# Patient Record
Sex: Male | Born: 1945 | Race: Black or African American | Hispanic: No | Marital: Single | Smoking: Former smoker
Health system: Southern US, Community
[De-identification: ages and names within clinical notes are randomized; demographics above are authoritative.]

## PROBLEM LIST (undated history)

## (undated) DIAGNOSIS — F1210 Cannabis abuse, uncomplicated: Secondary | ICD-10-CM

## (undated) DIAGNOSIS — R410 Disorientation, unspecified: Secondary | ICD-10-CM

## (undated) DIAGNOSIS — I10 Essential (primary) hypertension: Secondary | ICD-10-CM

## (undated) DIAGNOSIS — I447 Left bundle-branch block, unspecified: Secondary | ICD-10-CM

## (undated) DIAGNOSIS — C3491 Malignant neoplasm of unspecified part of right bronchus or lung: Principal | ICD-10-CM

## (undated) DIAGNOSIS — N183 Chronic kidney disease, stage 3 unspecified: Secondary | ICD-10-CM

## (undated) DIAGNOSIS — K056 Periodontal disease, unspecified: Secondary | ICD-10-CM

## (undated) DIAGNOSIS — F1010 Alcohol abuse, uncomplicated: Secondary | ICD-10-CM

## (undated) DIAGNOSIS — T783XXA Angioneurotic edema, initial encounter: Secondary | ICD-10-CM

## (undated) DIAGNOSIS — Z7189 Other specified counseling: Secondary | ICD-10-CM

## (undated) DIAGNOSIS — Z5111 Encounter for antineoplastic chemotherapy: Secondary | ICD-10-CM

## (undated) DIAGNOSIS — Z720 Tobacco use: Secondary | ICD-10-CM

## (undated) DIAGNOSIS — I428 Other cardiomyopathies: Secondary | ICD-10-CM

## (undated) DIAGNOSIS — I639 Cerebral infarction, unspecified: Secondary | ICD-10-CM

## (undated) HISTORY — DX: Periodontal disease, unspecified: K05.6

## (undated) HISTORY — DX: Angioneurotic edema, initial encounter: T78.3XXA

## (undated) HISTORY — DX: Cerebral infarction, unspecified: I63.9

## (undated) HISTORY — DX: Encounter for antineoplastic chemotherapy: Z51.11

## (undated) HISTORY — DX: Other specified counseling: Z71.89

## (undated) HISTORY — DX: Malignant neoplasm of unspecified part of right bronchus or lung: C34.91

## (undated) HISTORY — DX: Other cardiomyopathies: I42.8

## (undated) HISTORY — DX: Chronic kidney disease, stage 3 (moderate): N18.3

## (undated) HISTORY — DX: Tobacco use: Z72.0

## (undated) HISTORY — DX: Chronic kidney disease, stage 3 unspecified: N18.30

## (undated) HISTORY — DX: Cannabis abuse, uncomplicated: F12.10

## (undated) HISTORY — DX: Left bundle-branch block, unspecified: I44.7

## (undated) HISTORY — DX: Essential (primary) hypertension: I10

## (undated) HISTORY — DX: Disorientation, unspecified: R41.0

## (undated) HISTORY — DX: Alcohol abuse, uncomplicated: F10.10

---

## 2002-05-06 ENCOUNTER — Encounter: Payer: Self-pay | Admitting: Emergency Medicine

## 2002-05-06 ENCOUNTER — Emergency Department (HOSPITAL_COMMUNITY): Admission: EM | Admit: 2002-05-06 | Discharge: 2002-05-06 | Payer: Self-pay

## 2004-01-12 ENCOUNTER — Emergency Department (HOSPITAL_COMMUNITY): Admission: EM | Admit: 2004-01-12 | Discharge: 2004-01-12 | Payer: Self-pay | Admitting: Emergency Medicine

## 2004-04-22 ENCOUNTER — Emergency Department (HOSPITAL_COMMUNITY): Admission: EM | Admit: 2004-04-22 | Discharge: 2004-04-22 | Payer: Self-pay | Admitting: Emergency Medicine

## 2005-01-07 ENCOUNTER — Emergency Department (HOSPITAL_COMMUNITY): Admission: EM | Admit: 2005-01-07 | Discharge: 2005-01-07 | Payer: Self-pay | Admitting: Emergency Medicine

## 2011-01-28 HISTORY — PX: US ECHOCARDIOGRAPHY: HXRAD669

## 2011-02-06 ENCOUNTER — Emergency Department (HOSPITAL_COMMUNITY): Payer: PRIVATE HEALTH INSURANCE

## 2011-02-06 ENCOUNTER — Inpatient Hospital Stay (HOSPITAL_COMMUNITY)
Admission: EM | Admit: 2011-02-06 | Discharge: 2011-02-15 | DRG: 065 | Disposition: A | Discharge status: Skilled Nursing Facility | Payer: PRIVATE HEALTH INSURANCE | Attending: Internal Medicine | Admitting: Internal Medicine

## 2011-02-06 DIAGNOSIS — N183 Chronic kidney disease, stage 3 unspecified: Secondary | ICD-10-CM | POA: Diagnosis present

## 2011-02-06 DIAGNOSIS — F10239 Alcohol dependence with withdrawal, unspecified: Secondary | ICD-10-CM | POA: Diagnosis present

## 2011-02-06 DIAGNOSIS — I129 Hypertensive chronic kidney disease with stage 1 through stage 4 chronic kidney disease, or unspecified chronic kidney disease: Secondary | ICD-10-CM | POA: Diagnosis present

## 2011-02-06 DIAGNOSIS — IMO0001 Reserved for inherently not codable concepts without codable children: Secondary | ICD-10-CM | POA: Diagnosis present

## 2011-02-06 DIAGNOSIS — R410 Disorientation, unspecified: Secondary | ICD-10-CM

## 2011-02-06 DIAGNOSIS — K069 Disorder of gingiva and edentulous alveolar ridge, unspecified: Secondary | ICD-10-CM | POA: Diagnosis present

## 2011-02-06 DIAGNOSIS — F17200 Nicotine dependence, unspecified, uncomplicated: Secondary | ICD-10-CM | POA: Diagnosis present

## 2011-02-06 DIAGNOSIS — Z794 Long term (current) use of insulin: Secondary | ICD-10-CM

## 2011-02-06 DIAGNOSIS — I509 Heart failure, unspecified: Secondary | ICD-10-CM | POA: Diagnosis present

## 2011-02-06 DIAGNOSIS — I5020 Unspecified systolic (congestive) heart failure: Secondary | ICD-10-CM | POA: Diagnosis present

## 2011-02-06 DIAGNOSIS — F1210 Cannabis abuse, uncomplicated: Secondary | ICD-10-CM | POA: Diagnosis present

## 2011-02-06 DIAGNOSIS — F1020 Alcohol dependence, uncomplicated: Secondary | ICD-10-CM | POA: Diagnosis present

## 2011-02-06 DIAGNOSIS — F29 Unspecified psychosis not due to a substance or known physiological condition: Secondary | ICD-10-CM | POA: Diagnosis present

## 2011-02-06 DIAGNOSIS — F10939 Alcohol use, unspecified with withdrawal, unspecified: Secondary | ICD-10-CM | POA: Diagnosis present

## 2011-02-06 DIAGNOSIS — Z7982 Long term (current) use of aspirin: Secondary | ICD-10-CM

## 2011-02-06 DIAGNOSIS — I639 Cerebral infarction, unspecified: Secondary | ICD-10-CM

## 2011-02-06 DIAGNOSIS — T46905A Adverse effect of unspecified agents primarily affecting the cardiovascular system, initial encounter: Secondary | ICD-10-CM | POA: Diagnosis not present

## 2011-02-06 DIAGNOSIS — I6350 Cerebral infarction due to unspecified occlusion or stenosis of unspecified cerebral artery: Principal | ICD-10-CM | POA: Diagnosis present

## 2011-02-06 DIAGNOSIS — I428 Other cardiomyopathies: Secondary | ICD-10-CM | POA: Diagnosis present

## 2011-02-06 DIAGNOSIS — F1010 Alcohol abuse, uncomplicated: Secondary | ICD-10-CM

## 2011-02-06 DIAGNOSIS — E876 Hypokalemia: Secondary | ICD-10-CM | POA: Diagnosis present

## 2011-02-06 DIAGNOSIS — K056 Periodontal disease, unspecified: Secondary | ICD-10-CM | POA: Diagnosis present

## 2011-02-06 DIAGNOSIS — I447 Left bundle-branch block, unspecified: Secondary | ICD-10-CM

## 2011-02-06 DIAGNOSIS — T783XXA Angioneurotic edema, initial encounter: Secondary | ICD-10-CM

## 2011-02-06 HISTORY — DX: Disorientation, unspecified: R41.0

## 2011-02-06 HISTORY — DX: Left bundle-branch block, unspecified: I44.7

## 2011-02-06 HISTORY — DX: Cannabis abuse, uncomplicated: F12.10

## 2011-02-06 HISTORY — DX: Alcohol abuse, uncomplicated: F10.10

## 2011-02-06 HISTORY — DX: Cerebral infarction, unspecified: I63.9

## 2011-02-06 HISTORY — DX: Angioneurotic edema, initial encounter: T78.3XXA

## 2011-02-06 LAB — POCT I-STAT, CHEM 8
BUN: 15 mg/dL (ref 6–23)
Calcium, Ion: 1.12 mmol/L (ref 1.12–1.32)
Chloride: 104 meq/L (ref 96–112)
Creatinine, Ser: 1.7 mg/dL — ABNORMAL HIGH (ref 0.4–1.5)
Glucose, Bld: 162 mg/dL — ABNORMAL HIGH (ref 70–99)
HCT: 50 % (ref 39.0–52.0)
Hemoglobin: 17 g/dL (ref 13.0–17.0)
Potassium: 3.2 meq/L — ABNORMAL LOW (ref 3.5–5.1)
Sodium: 140 meq/L (ref 135–145)
TCO2: 18 mmol/L (ref 0–100)

## 2011-02-06 LAB — URINE MICROSCOPIC-ADD ON

## 2011-02-06 LAB — URINALYSIS, ROUTINE W REFLEX MICROSCOPIC
Glucose, UA: NEGATIVE mg/dL
Hgb urine dipstick: NEGATIVE
Ketones, ur: 15 mg/dL — AB
Protein, ur: 100 mg/dL — AB
Urobilinogen, UA: 0.2 mg/dL (ref 0.0–1.0)

## 2011-02-06 LAB — DIFFERENTIAL
Basophils Absolute: 0.1 10*3/uL (ref 0.0–0.1)
Basophils Relative: 1 % (ref 0–1)
Eosinophils Absolute: 0.1 10*3/uL (ref 0.0–0.7)
Eosinophils Relative: 1 % (ref 0–5)
Lymphocytes Relative: 17 % (ref 12–46)
Lymphs Abs: 2.1 10*3/uL (ref 0.7–4.0)
Monocytes Absolute: 1.1 10*3/uL — ABNORMAL HIGH (ref 0.1–1.0)
Neutrophils Relative %: 72 % (ref 43–77)

## 2011-02-06 LAB — CBC
HCT: 46.5 % (ref 39.0–52.0)
Hemoglobin: 15.8 g/dL (ref 13.0–17.0)
MCH: 31 pg (ref 26.0–34.0)
MCHC: 34 g/dL (ref 30.0–36.0)
MCV: 91.4 fL (ref 78.0–100.0)
Platelets: 218 K/uL (ref 150–400)
RBC: 5.09 MIL/uL (ref 4.22–5.81)
RDW: 13.8 % (ref 11.5–15.5)
WBC: 12 K/uL — ABNORMAL HIGH (ref 4.0–10.5)

## 2011-02-06 LAB — POCT CARDIAC MARKERS
CKMB, poc: 2.4 ng/mL (ref 1.0–8.0)
Myoglobin, poc: 254 ng/mL (ref 12–200)
Troponin i, poc: <0.05 ng/mL (ref 0.00–0.09)

## 2011-02-06 LAB — PROTIME-INR
INR: 1.09 (ref 0.00–1.49)
Prothrombin Time: 14.3 s (ref 11.6–15.2)

## 2011-02-06 LAB — D-DIMER, QUANTITATIVE: D-Dimer, Quant: 1.41 ug/mL-FEU — ABNORMAL HIGH (ref 0.00–0.48)

## 2011-02-07 ENCOUNTER — Emergency Department (HOSPITAL_COMMUNITY): Payer: PRIVATE HEALTH INSURANCE

## 2011-02-07 ENCOUNTER — Inpatient Hospital Stay (HOSPITAL_COMMUNITY): Payer: PRIVATE HEALTH INSURANCE

## 2011-02-07 DIAGNOSIS — I6350 Cerebral infarction due to unspecified occlusion or stenosis of unspecified cerebral artery: Secondary | ICD-10-CM

## 2011-02-07 DIAGNOSIS — I517 Cardiomegaly: Secondary | ICD-10-CM

## 2011-02-07 LAB — MAGNESIUM: Magnesium: 1.6 mg/dL (ref 1.5–2.5)

## 2011-02-07 LAB — CARDIAC PANEL(CRET KIN+CKTOT+MB+TROPI)
CK, MB: 4.5 ng/mL — ABNORMAL HIGH (ref 0.3–4.0)
CK, MB: 4.4 ng/mL — ABNORMAL HIGH (ref 0.3–4.0)
Relative Index: 1.3 (ref 0.0–2.5)
Relative Index: 1.2 (ref 0.0–2.5)
Total CK: 337 U/L — ABNORMAL HIGH (ref 7–232)
Troponin I: 0.04 ng/mL (ref 0.00–0.06)
Troponin I: 0.03 ng/mL (ref 0.00–0.06)

## 2011-02-07 LAB — LIPID PANEL
Cholesterol: 197 mg/dL (ref 0–200)
HDL: 85 mg/dL (ref >39)

## 2011-02-07 LAB — COMPREHENSIVE METABOLIC PANEL
ALT: 24 U/L (ref 0–53)
Alkaline Phosphatase: 64 U/L (ref 39–117)
BUN: 12 mg/dL (ref 6–23)
CO2: 21 mEq/L (ref 19–32)
Chloride: 106 mEq/L (ref 96–112)
GFR calc non Af Amer: >60 mL/min (ref >60)
Glucose, Bld: 132 mg/dL — ABNORMAL HIGH (ref 70–99)
Potassium: 3.2 mEq/L — ABNORMAL LOW (ref 3.5–5.1)
Sodium: 139 mEq/L (ref 135–145)
Total Bilirubin: 0.9 mg/dL (ref 0.3–1.2)
Total Protein: 6.6 g/dL (ref 6.0–8.3)

## 2011-02-07 LAB — APTT: aPTT: 33 seconds (ref 24–37)

## 2011-02-07 LAB — CK TOTAL AND CKMB (NOT AT ARMC)
CK, MB: 5 ng/mL — ABNORMAL HIGH (ref 0.3–4.0)
Total CK: 261 U/L — ABNORMAL HIGH (ref 7–232)

## 2011-02-07 LAB — GLUCOSE, CAPILLARY: Glucose-Capillary: 124 mg/dL — ABNORMAL HIGH (ref 70–99)

## 2011-02-07 LAB — RAPID URINE DRUG SCREEN, HOSP PERFORMED
Amphetamines: NOT DETECTED
Barbiturates: NOT DETECTED
Opiates: NOT DETECTED

## 2011-02-07 MED ORDER — XENON XE 133 GAS
10.0000 | GAS_FOR_INHALATION | Freq: Once | RESPIRATORY_TRACT | Status: AC | PRN
  Administered 2011-02-07: 10 via RESPIRATORY_TRACT

## 2011-02-07 MED ORDER — GADOBENATE DIMEGLUMINE 529 MG/ML IV SOLN
14.0000 mL | Freq: Once | INTRAVENOUS | Status: AC
  Administered 2011-02-07: 14 mL via INTRAVENOUS

## 2011-02-07 MED ORDER — TECHNETIUM TO 99M ALBUMIN AGGREGATED
6.0000 | Freq: Once | INTRAVENOUS | Status: AC | PRN
  Administered 2011-02-07: 6 via INTRAVENOUS

## 2011-02-08 DIAGNOSIS — R9389 Abnormal findings on diagnostic imaging of other specified body structures: Secondary | ICD-10-CM

## 2011-02-08 LAB — CBC
MCH: 31 pg (ref 26.0–34.0)
MCHC: 33.6 g/dL (ref 30.0–36.0)
MCV: 92.3 fL (ref 78.0–100.0)
Platelets: 179 10*3/uL (ref 150–400)
RDW: 13.9 % (ref 11.5–15.5)
WBC: 11.2 10*3/uL — ABNORMAL HIGH (ref 4.0–10.5)

## 2011-02-08 LAB — DIFFERENTIAL
Eosinophils Absolute: 0.1 10*3/uL (ref 0.0–0.7)
Eosinophils Relative: 1 % (ref 0–5)
Lymphs Abs: 2.1 10*3/uL (ref 0.7–4.0)
Monocytes Absolute: 0.7 10*3/uL (ref 0.1–1.0)
Monocytes Relative: 6 % (ref 3–12)

## 2011-02-08 LAB — BASIC METABOLIC PANEL
BUN: 12 mg/dL (ref 6–23)
Creatinine, Ser: 0.82 mg/dL (ref 0.4–1.5)
GFR calc non Af Amer: >60 mL/min (ref >60)
Glucose, Bld: 198 mg/dL — ABNORMAL HIGH (ref 70–99)

## 2011-02-08 LAB — MAGNESIUM: Magnesium: 1.9 mg/dL (ref 1.5–2.5)

## 2011-02-08 LAB — GLUCOSE, CAPILLARY: Glucose-Capillary: 129 mg/dL — ABNORMAL HIGH (ref 70–99)

## 2011-02-08 NOTE — H&P (Signed)
Jim [name], Jim [name]              ACCOUNT NO.:  [id number]  MEDICAL RECORD NO.:  [id number]           PATIENT TYPE:  E  LOCATION:  [facility]                         FACILITY:  [facility]  PHYSICIAN:  Homero Fellers, MD   DATE OF BIRTH:  April 07, 1946  DATE OF ADMISSION:  02/06/2011 DATE OF DISCHARGE:                             HISTORY & PHYSICAL   PRIMARY CARE PHYSICIAN:  Dr. Apolinar Junes.  CHIEF COMPLAINTS:  Left leg weakness and ambulatory difficulties.  HISTORY OF PRESENT ILLNESS:  This is a 66 year old African American gentleman who developed an acute episode of left leg weakness around noon today accompanied by inability to bear weight on the left leg or walk.  The patient said he just lost control of his left leg suddenly. There was no warning signs, that was not accompanied with left upper extremity weakness, slurred speech, nausea, or vomiting.  He also does not have any chest pain, shortness of breath, or even palpitation.  He had some dizziness, but no loss of consciousness.  In the emergency room, initial CT of the head did not show any acute stroke.  EKG showed a left bundle-branch block, but there are no old EKGs for comparison. Cardiology was called in and the patient was seen and considered not to be having any acute myocardial infarction.  His cardiac enzymes are normal.  The patient had LFTs and his alcohol level at presentation was 88.  He however does not have any evidence of confusion or disorientation.  The patient was diagnosed with high blood pressure years ago, but has not been placed on any medication by his physician.  PAST MEDICAL HISTORY:  High blood pressure, diabetes mellitus.  Medication list unavailable at this time.  ALLERGIES:  None.  SOCIAL HISTORY:  Smokes about half pack per day.  No IV drug abuse. Uses marijuana at times and drinks moderately.  FAMILY HISTORY:  Mother died of breast cancer, history of coronary artery disease in  grandparents.  Ten-point review of systems is negative except as above.  PHYSICAL EXAMINATION:  VITAL SIGNS:  Blood pressure is 149/94, pulse initially 133-140, but currently about 120, last pulse rate was actually 113, temperature 98.5, respirations 24, O2 sat is 100%. GENERAL:  The patient is doing well, appear comfortable in no distress. Left leg weakness has improved when I saw him. HEENT:  Pallor.  Extraocular movements are intact. NECK:  Supple.  No JVD, adenopathy, or thyromegaly. LUNGS:  Clear bilaterally to auscultation. HEART:  S1 and S2.  No murmurs, rubs, or gallops. ABDOMEN:  Full, soft, nontender.  Bowel sounds present.  No masses. EXTREMITIES:  No edema, clubbing, or cyanosis. NEUROLOGIC:  Cranial nerves II through XII intact.  Speech is clear. Motor function appear to be depressed on the left lower extremity, probably about 4/5 in power.  Coordination is normal on both sides. Gait was not tested.  LABORATORY DATA:  Urinalysis unremarkable apart from mild proteinuria. Urinalysis showed many bacteria, but WBC is normal.  Alcohol level 88. INR 1.09.  White count 12.0, hemoglobin 15.8, platelet count is 218. Glucose 162, potassium 3.2, sodium 140, creatinine 1.7, BUN 15.  Head CT showed no acute findings, mild atrophy and mild chronic small vessel white matter ischemic changes in both cerebral hemisphere with old lacunar infarcts.  D-dimer is 1.41, which is elevated.  Chest x-ray showed no acute cardiopulmonary disease.  Again EKG showed clear bundle- branch block with questionable Q-waves in the precordial leads.  ASSESSMENT: 1. This is a 65 year old man with history of high blood pressure and     diabetes presenting with acute left lower extremity weakness to     rule out an acute cerebrovascular accident.  If no acute infarct is     found on MRI, this should be considered as a transient ischemic     attack. 2. Left bundle-branch block with no old EKG.  This is  considered as     new and should be worked up as acute coronary syndrome until     otherwise. 3. Uncontrolled diabetes mellitus. 4. Uncontrolled high blood pressure. 5. Hypokalemia. 6. Elevated D-dimer; in view of tachycardia, I think it is reasonable     to rule out pulmonary embolism. 7. History of alcohol and tobacco abuse. 8. Chronic kidney disease, stage III.  PLAN: 1. Admit to Stroke Unit, do a stroke workup including MRI/MRA of the     brain, 2-D echo and carotid Dopplers, get a VQ scan to rule out PE. 2. Get hemoglobin A1c, start metformin and watch kidney function.  If     getting worse, consider other alternatives.  Replace potassium and     check magnesium level.  Give one dose of Lopressor 50 mg x1 then 25     mg b.i.d.  The patient's condition is overall stable, will be followed by Cardiology for this admission.     Homero Fellers, MD     FA/MEDQ  D:  02/07/2011  T:  02/07/2011  Job:  [id number]  Electronically Signed by Homero Fellers  on 02/08/2011 09:30:26 AM

## 2011-02-09 DIAGNOSIS — I6789 Other cerebrovascular disease: Secondary | ICD-10-CM

## 2011-02-09 DIAGNOSIS — I428 Other cardiomyopathies: Secondary | ICD-10-CM

## 2011-02-09 LAB — GLUCOSE, CAPILLARY
Glucose-Capillary: 157 mg/dL — ABNORMAL HIGH (ref 70–99)
Glucose-Capillary: 128 mg/dL — ABNORMAL HIGH (ref 70–99)
Glucose-Capillary: 170 mg/dL — ABNORMAL HIGH (ref 70–99)

## 2011-02-09 LAB — BASIC METABOLIC PANEL
BUN: 9 mg/dL (ref 6–23)
Calcium: 9.4 mg/dL (ref 8.4–10.5)
GFR calc non Af Amer: >60 mL/min (ref >60)
Glucose, Bld: 142 mg/dL — ABNORMAL HIGH (ref 70–99)
Potassium: 4 mEq/L (ref 3.5–5.1)
Sodium: 136 mEq/L (ref 135–145)

## 2011-02-09 LAB — CBC
Hemoglobin: 14.4 g/dL (ref 13.0–17.0)
MCH: 31.1 pg (ref 26.0–34.0)
MCV: 92.7 fL (ref 78.0–100.0)
RBC: 4.63 MIL/uL (ref 4.22–5.81)
WBC: 9.3 10*3/uL (ref 4.0–10.5)

## 2011-02-09 LAB — PROTIME-INR: Prothrombin Time: 14.2 seconds (ref 11.6–15.2)

## 2011-02-09 LAB — MAGNESIUM: Magnesium: 2.2 mg/dL (ref 1.5–2.5)

## 2011-02-10 LAB — CBC
HCT: 42.2 % (ref 39.0–52.0)
MCH: 30.9 pg (ref 26.0–34.0)
MCV: 91.7 fL (ref 78.0–100.0)
RDW: 14.2 % (ref 11.5–15.5)
WBC: 11 10*3/uL — ABNORMAL HIGH (ref 4.0–10.5)

## 2011-02-10 LAB — GLUCOSE, CAPILLARY
Glucose-Capillary: 131 mg/dL — ABNORMAL HIGH (ref 70–99)
Glucose-Capillary: 139 mg/dL — ABNORMAL HIGH (ref 70–99)

## 2011-02-10 LAB — BASIC METABOLIC PANEL
CO2: 24 mEq/L (ref 19–32)
Chloride: 102 mEq/L (ref 96–112)
GFR calc Af Amer: >60 mL/min (ref >60)
Potassium: 3.9 mEq/L (ref 3.5–5.1)

## 2011-02-11 LAB — GLUCOSE, CAPILLARY

## 2011-02-11 LAB — BASIC METABOLIC PANEL
BUN: 15 mg/dL (ref 6–23)
CO2: 23 mEq/L (ref 19–32)
Calcium: 9.8 mg/dL (ref 8.4–10.5)
Creatinine, Ser: 0.97 mg/dL (ref 0.4–1.5)
Glucose, Bld: 145 mg/dL — ABNORMAL HIGH (ref 70–99)

## 2011-02-12 ENCOUNTER — Inpatient Hospital Stay (HOSPITAL_COMMUNITY): Payer: PRIVATE HEALTH INSURANCE

## 2011-02-12 DIAGNOSIS — I6330 Cerebral infarction due to thrombosis of unspecified cerebral artery: Secondary | ICD-10-CM

## 2011-02-12 LAB — FOLATE RBC: RBC Folate: 849 ng/mL — ABNORMAL HIGH

## 2011-02-12 LAB — CBC
MCV: 92.6 fL (ref 78.0–100.0)
Platelets: 159 10*3/uL (ref 150–400)
RDW: 13.8 % (ref 11.5–15.5)
WBC: 10.4 10*3/uL (ref 4.0–10.5)

## 2011-02-12 LAB — GLUCOSE, CAPILLARY
Glucose-Capillary: 135 mg/dL — ABNORMAL HIGH (ref 70–99)
Glucose-Capillary: 215 mg/dL — ABNORMAL HIGH (ref 70–99)

## 2011-02-12 LAB — BASIC METABOLIC PANEL
BUN: 14 mg/dL (ref 6–23)
Chloride: 100 mEq/L (ref 96–112)
Creatinine, Ser: 0.99 mg/dL (ref 0.4–1.5)

## 2011-02-12 LAB — RPR: RPR Ser Ql: NONREACTIVE

## 2011-02-12 LAB — VITAMIN B12: Vitamin B-12: 257 pg/mL (ref 211–911)

## 2011-02-13 LAB — CBC
HCT: 42.8 % (ref 39.0–52.0)
MCV: 92.4 fL (ref 78.0–100.0)
RBC: 4.63 MIL/uL (ref 4.22–5.81)
WBC: 10.4 10*3/uL (ref 4.0–10.5)

## 2011-02-13 LAB — BASIC METABOLIC PANEL
GFR calc non Af Amer: >60 mL/min (ref >60)
Glucose, Bld: 186 mg/dL — ABNORMAL HIGH (ref 70–99)
Potassium: 4.1 mEq/L (ref 3.5–5.1)
Sodium: 136 mEq/L (ref 135–145)

## 2011-02-13 LAB — DIFFERENTIAL
Eosinophils Relative: 1 % (ref 0–5)
Lymphocytes Relative: 23 % (ref 12–46)
Lymphs Abs: 2.4 10*3/uL (ref 0.7–4.0)
Neutrophils Relative %: 61 % (ref 43–77)

## 2011-02-13 LAB — GLUCOSE, CAPILLARY
Glucose-Capillary: 124 mg/dL — ABNORMAL HIGH (ref 70–99)
Glucose-Capillary: 170 mg/dL — ABNORMAL HIGH (ref 70–99)

## 2011-02-14 LAB — DIFFERENTIAL
Basophils Absolute: 0 10*3/uL (ref 0.0–0.1)
Basophils Relative: 0 % (ref 0–1)
Eosinophils Absolute: 0.1 10*3/uL (ref 0.0–0.7)
Eosinophils Relative: 1 % (ref 0–5)
Lymphocytes Relative: 24 % (ref 12–46)
Monocytes Absolute: 1.4 10*3/uL — ABNORMAL HIGH (ref 0.1–1.0)

## 2011-02-14 LAB — BASIC METABOLIC PANEL
Calcium: 9.7 mg/dL (ref 8.4–10.5)
GFR calc Af Amer: >60 mL/min (ref >60)
GFR calc non Af Amer: >60 mL/min (ref >60)
Glucose, Bld: 189 mg/dL — ABNORMAL HIGH (ref 70–99)
Potassium: 4 mEq/L (ref 3.5–5.1)
Sodium: 136 mEq/L (ref 135–145)

## 2011-02-14 LAB — CBC
MCHC: 33.9 g/dL (ref 30.0–36.0)
Platelets: 198 10*3/uL (ref 150–400)
RDW: 13.7 % (ref 11.5–15.5)
WBC: 11.5 10*3/uL — ABNORMAL HIGH (ref 4.0–10.5)

## 2011-02-14 LAB — GLUCOSE, CAPILLARY
Glucose-Capillary: 218 mg/dL — ABNORMAL HIGH (ref 70–99)
Glucose-Capillary: 148 mg/dL — ABNORMAL HIGH (ref 70–99)
Glucose-Capillary: 245 mg/dL — ABNORMAL HIGH (ref 70–99)

## 2011-02-14 LAB — MAGNESIUM: Magnesium: 2.2 mg/dL (ref 1.5–2.5)

## 2011-02-15 LAB — GLUCOSE, CAPILLARY: Glucose-Capillary: 157 mg/dL — ABNORMAL HIGH (ref 70–99)

## 2011-02-21 NOTE — Consult Note (Signed)
NAMERUBERT, FREDIANI              ACCOUNT NO.:  [id number]  MEDICAL RECORD NO.:  [id number]           PATIENT TYPE:  I  LOCATION:  3029                         FACILITY:  [facility]  PHYSICIAN:  Thana Farr, MD    DATE OF BIRTH:  1946/01/03  DATE OF CONSULTATION:  02/07/2011 DATE OF DISCHARGE:                                CONSULTATION   TIME:  10:20 a.m.  REASON FOR CONSULTATION:  Sudden onset left leg weakness, questionable intracranial abnormalities.  HISTORY OF PRESENT ILLNESS:  This is a pleasant 65 year old African American male who has known hypertension, diabetes, and is on insulin at home.  The patient states he does have one-half pack per day habit of smoking.  He does drink approximately 2 times one-fifth of alcohol throughout the week and does take part in [facility].  The patient was at his baseline on February 06, 2011, at approximately 1 p.m. when he had sudden onset of left leg weakness.  The patient states that the only symptom he noted was weakness.  He had no sensory change, no dysarthria, no slurred speech, no vision changes.  The patient tried to get up and walk and noted that he was significantly weak with his left leg and needed assistance by holding onto furniture.  At that time, he believed that he could resolve this symptom by going to be alcohol store and having some alcohol.  He had his son drive him to the local [employer] store however on return at approximately 8 p.m. he noted his symptoms were not dissipating and his friend drove him to [facility] ED.  At [facility] ED, the patient underwent a CT scan of the head, which was negative; however, the patient continues to have left leg weakness.  Neurology asked to consult for possibility of intracranial abnormalities.  PAST MEDICAL HISTORY:  Hypertension, diabetes.  MEDICATIONS:  The patient is on insulin at home.  While he has been in the hospital, he has been on aspirin 325 mg, low-molecular-weight heparin,  insulin, lisinopril, Lopressor, Protonix, potassium chloride, and Crestor 20 mg daily.  ALLERGIES:  No known drug allergies.  FAMILY HISTORY:  Positive for breast cancer and CAD.  SOCIAL HISTORY:  The patient smokes approximately half pack per day, does take part in drinking.  States he does not do illicit drugs; however, he did admit to doing THC.  REVIEW OF SYSTEMS:  Negative with the exception of the above.  PHYSICAL EXAMINATION:  VITAL SIGNS:  Blood pressure 139/80, pulse 76, respiration 20, temperature 98.5. NEUROLOGIC:  The patient is alert, is oriented x3, carries out 2 and 3- step commands, pupils are equal, round, reactive to light and accommodating.  Conjugate gaze.  Extraocular movements are intact. Visual fields grossly intact.  Tongue is midline.  Uvula is midline.  No dysarthria, no aphasia, no slurred speech.  The patient does show a left nasolabial fold decrease, but his smile is symmetrical.  The patient's sensation is grossly intact to pinprick, light touch throughout. Shoulder shrug and head turn within normal limits. COORDINATION:  Finger-to-nose is smooth.  Heel-to-shin is smooth. MOTOR:  The patient shows 5/5  strength on his right upper and lower extremity.  On his left upper extremity, he has 5/5, with his biceps flexion 4/5, his triceps extension 4/5 with his hip flexion on the left 4/5 on the left knee extension 5/5 with dorsiflexion, plantar flexion on the left.  Deep tendon reflexes are 1+ throughout.  Downgoing toes bilaterally. DRIFT:  Positive left lower extremity drift. SENSATION:  Grossly intact to pinprick and light touch throughout. PULMONARY:  Clear to auscultation bilaterally. CARDIOVASCULAR:  S1 and S2.  Regular rate and rhythm. NECK:  Negative bruits and supple.  LABORATORY DATA:  Positive for THC.  D-dimer is 1.41.  Sodium 139, potassium 3.2, chloride 106, CO2 of 21, BUN 12, creatinine 1.16, glucose 132.  Hemoglobin 15.8, hematocrit 46.5,  white blood cell count 12.0, platelets 218.  Triglycerides 154, cholesterol 197, HDL 85, LDL 81. HbA1c is pending.  TSH 0.94.  IMAGING:  CT of head was negative for new infarct, positive old lacunar infarcts.  ASSESSMENT/PLAN:  This is a pleasant 65 year old male with sudden onset of left leg weakness that has continued from February 06, 2011, through February 07, 2011. 1. At the present time, CT scan of head is negative; however, cannot     rule out intracranial abnormalities. 2. At this time, we would recommend continuing stroke workup and start     aspirin daily. 3. I have changed the patient's medication and added 20 mg of Crestor     daily for hyperlipidemia. 4. We will continue to follow the patient while he is in-house.  I     have discussed these findings with Dr. Thad Ranger and she is in full     agreement.     Felicie Morn, PA-C   ______________________________ Thana Farr, MD    DS/MEDQ  D:  02/07/2011  T:  02/08/2011  Job:  [id number]  Electronically Signed by Felicie Morn PA-C on 02/12/2011 11:53:40 AM Electronically Signed by Thana Farr MD on 02/21/2011 06:48:11 PM

## 2011-02-23 NOTE — Consult Note (Signed)
NAMESHERVIN, CYPERT              ACCOUNT NO.:  [id number]  MEDICAL RECORD NO.:  [id number]           PATIENT TYPE:  I  LOCATION:  3029                         FACILITY:  [facility]  PHYSICIAN:  Colleen Can. Deborah Chalk, M.D.DATE OF BIRTH:  01/10/1946  DATE OF CONSULTATION:  02/08/2011 DATE OF DISCHARGE:                                CONSULTATION   PRIMARY CARE PHYSICIAN:  Dr. Apolinar Junes.  PRIMARY CARDIOLOGIST:  None.  CHIEF COMPLAINT:  Left ventricular dysfunction.  HISTORY OF PRESENT ILLNESS:  Jim [name] is a 65 year old male with no cardiac history.  Jim [name] was admitted on April 10 with new-onset weakness and was diagnosed with a CVA.  His EF was decreased on 2-D echocardiogram and Cardiology was asked to evaluate him.  Jim [name] never gets chest pain.  Occasionally, Jim [name] gets tachy palpitations with shortness of breath during emotional stress.  These symptoms never occur with exertion.  They resolve once Jim [name] calms down. They happen occasionally but not often.  Jim [name] denies any history of dyspnea on exertion, orthopnea, PND, or lower extremity edema.  Jim [name] has no history of palpitations except during periods of emotional stress and these do not last very long.  Jim [name] has occasional left knee swelling, but no history of lower extremity edema.  Since being in the hospital, Jim [name] has had no chest pain or shortness of breath.  PAST MEDICAL HISTORY: 1. Status post echocardiogram on February 07, 2011, showing an EF of 35-     40% with mild LVH. 2. Diabetes. 3. Hypertension. 4. Hyperlipidemia. 5. Emphysema.  PAST SURGICAL HISTORY:  None.  ALLERGIES:  None.  CURRENT MEDICATIONS: 1. Aspirin 325 mg a day. 2. DVT Lovenox. 3. Folic acid 1 mg a day. 4. Sliding scale insulin. 5. Lisinopril 10 mg a day. 6. Ativan. 7. Magnesium sulfate p.r.n. 8. Lopressor 25 mg b.i.d. 9. Multivitamin. 10.Nicotine patch. 11.Protonix 40 mg a day. 12.Potassium p.r.n. 13.Thiamine 100 mg daily.  SOCIAL HISTORY:  Jim [name]  lives in [place] with family nearby.  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  Jim [name] has a greater than 30-pack-year history of ongoing tobacco use and states Jim [name] has not had a drink in awhile but has a history of drinking 2 pints a week and when his left lower extremity first became weak Jim [name] went to the liquor store because Jim [name] thought drinking alcohol might improve it.  Jim [name] admits to [facility] use.  FAMILY HISTORY:  His mother died in her 55s with cancer and his father died in his 35s, but neither of his parents nor any siblings have heart disease, although his grandfather died in his 76s from an MI.  REVIEW OF SYSTEMS:  Shortness of breath and palpitations as described above.  Jim [name] coughs and wheezes occasionally.  His left lower extremity is weak but Jim [name] has no other areas of weakness or deficits.  Jim [name] does not tolerate cold weather well.  Jim [name] has not had any recent illnesses fevers or chills.  Full 14-point review of systems is otherwise negative except as stated in the HPI.  PHYSICAL EXAMINATION:  VITAL SIGNS:  Temperature is 97.9, blood pressure 146/96, pulse 80, respiratory rate 18, and O2 saturation 100% on room air. GENERAL:  Jim [name] is a well-developed [place] American male in no acute distress. HEENT:  Essentially normal. NECK:  There is no lymphadenopathy, thyromegaly, or bruit noted and JVD is essentially normal at 6 cm. CV:  His heart is regular in rate and rhythm with an S1 and S2 and no significant murmur, rub, or gallop is noted.  Distal pulses are intact in all 4 extremities. LUNGS:  Jim [name] has few rales in the bases but generally appear clear. SKIN:  No rashes or lesions are noted. ABDOMEN:  Soft and nontender with active bowel sounds. EXTREMITIES:  There is no cyanosis, clubbing, or edema noted. MUSCULOSKELETAL:  There is no joint deformity or effusions and no spine or CVA tenderness. NEUROLOGIC:  Jim [name] is alert and oriented  with cranial nerves II through XII grossly intact.  Jim [name] has decreased strength in the left lower extremity only.  Chest x-ray; no acute disease.  V/Q scan; low probability for PE.  MRA shows an occlusion of the right anterior cerebral artery A2 segment with an acute right ACA infarct.  EKG; sinus tach, rate 118 with a left bundle-branch block and no old is available for comparison.  LABORATORY VALUES:  CK-MB 370/4.4, then 337/4.5, then 261/5.0 with troponin I 0.03, then 0.04, then 0.03, and CK-MB index all normal. Hemoglobin 14.9, hematocrit 44.4, WBCs 11.2, and platelets 179.  Sodium 138, potassium 3.6, chloride 107, CO2 of 22, BUN initially 15, now 12. Creatinine initially 1.7, now 0.82.  Glucose 198.  Hemoglobin A1c 6.5. Urine drug screen positive for THC.  Urinalysis positive for many bacteria.  Magnesium 1.9.  EtOH on admission 88.  Total cholesterol 197, triglycerides 154, HDL 85, and LDL 81.  IMPRESSION:  Jim [name] was seen today by Dr. Deborah Chalk, the patient evaluated and the data reviewed.  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  His initial 2-D echo showed mild left ventricular hypertrophy and an ejection fraction of 40-45%.  Jim [name] has a left bundle-branch block of unknown duration.  There are many possible causes of this decreased ejection fraction including EtOH, hypertension, and possible ischemia. Currently, Jim [name] is in normal sinus rhythm.  His exam is otherwise unremarkable.  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  His cardiomyopathy is possibly related to ischemia but most likely related to EtOH or hypertension.  A TEE is planned on February 09, 2011, which will give [place] a better look at his left ventricular function.  Then, we can consider a Lexiscan Myoview if Jim [name] has regional wall motion abnormalities.  This could be done as an outpatient.  Jim [name] is currently stable from a cardiovascular standpoint and we will  continue to follow him with you.     Theodore Demark, PA-C   ______________________________ Colleen Can. Deborah Chalk, M.D.    RB/MEDQ  D:  02/08/2011  T:  02/09/2011  Job:  [id number]  Electronically Signed by Theodore Demark PA-C on 02/14/2011 10:23:03 AM Electronically Signed by Roger Shelter M.D. on 02/23/2011 03:52:37 PM

## 2011-02-27 ENCOUNTER — Encounter: Payer: Self-pay | Admitting: Cardiovascular Disease

## 2011-03-01 ENCOUNTER — Ambulatory Visit: Payer: 59 | Admitting: Cardiovascular Disease

## 2011-03-01 ENCOUNTER — Telehealth: Payer: Self-pay | Admitting: Cardiovascular Disease

## 2011-03-01 NOTE — Telephone Encounter (Signed)
Records received via Courier...records were sent to [facility] today gave to Christine(covering Nishan) for appt 03/01/11 @ 11:45. 03/01/11/km

## 2011-03-02 ENCOUNTER — Encounter: Payer: Self-pay | Admitting: Nurse Practitioner

## 2011-03-06 ENCOUNTER — Other Ambulatory Visit: Payer: Self-pay | Admitting: Internal Medicine

## 2011-03-06 ENCOUNTER — Encounter: Payer: Self-pay | Admitting: Cardiovascular Disease

## 2011-03-06 DIAGNOSIS — G8194 Hemiplegia, unspecified affecting left nondominant side: Secondary | ICD-10-CM

## 2011-03-07 ENCOUNTER — Other Ambulatory Visit (HOSPITAL_COMMUNITY): Payer: 59

## 2011-03-07 ENCOUNTER — Ambulatory Visit (HOSPITAL_COMMUNITY)
Admission: RE | Admit: 2011-03-07 | Discharge: 2011-03-07 | Disposition: A | Discharge status: Home / Self Care | Payer: PRIVATE HEALTH INSURANCE | Source: Ambulatory Visit | Attending: Internal Medicine | Admitting: Internal Medicine

## 2011-03-07 DIAGNOSIS — G8190 Hemiplegia, unspecified affecting unspecified side: Secondary | ICD-10-CM | POA: Insufficient documentation

## 2011-03-07 DIAGNOSIS — G8194 Hemiplegia, unspecified affecting left nondominant side: Secondary | ICD-10-CM

## 2011-03-07 DIAGNOSIS — I10 Essential (primary) hypertension: Secondary | ICD-10-CM | POA: Insufficient documentation

## 2011-03-07 DIAGNOSIS — E119 Type 2 diabetes mellitus without complications: Secondary | ICD-10-CM | POA: Insufficient documentation

## 2011-03-07 NOTE — Group Therapy Note (Signed)
[name], [name]              ACCOUNT NO.:  [id number]  MEDICAL RECORD NO.:  [id number]           PATIENT TYPE:  I  LOCATION:  3029                         FACILITY:  [facility]  PHYSICIAN:  Ramiro Harvest, MD    DATE OF BIRTH:  Mar 07, 1946                                PROGRESS NOTE   This is a progress note covering the events from February 06, 2011 through February 13, 2011.  PRIMARY CARE PHYSICIAN: Dr. Apolinar Junes.  CONSULTANTS ON THE CASE: Cardiology, Dr. Roger Shelter consulted on the patient February 08, 2011. Neurology consultation was done.  The patient was consulted on upon by Dr. Thana Farr of Neurology on February 07, 2011.  CURRENT DIAGNOSES: 1. Facial swelling likely secondary to ACE inhibitor angioedema. 2. Acute cerebrovascular accident. 3. Cardiomyopathy/left ventricular dysfunction. 4. Type 2 diabetes, hemoglobin A1c of 6.5. 5. Alcohol withdrawal. 6. Confusion/sundowning likely secondary to acute cerebrovascular    accident and alcohol withdrawal. 7. Extensive dental/periodontal disease/  DISCHARGE MEDICATIONS: Will be dictated per discharging physician.  PROCEDURES PERFORMED: 1. Chest x-ray was done, February 06, 2011, that showed no acute     cardiopulmonary process seen. 2. CT of the head without contrast was done February 06, 2011 that showed     no acute abnormality, mild atrophy, and mild chronic small vessel     white matter disease, ischemic changes in both cerebral hemispheres     old lacunar infarcts. 3. A V/Q scan was done February 07, 2011 that showed no segmental or     lobar ventilation.  No perfusion defect is seen.  No ventilation-     perfusion mismatch is seen.  Low probability for pulmonary     embolism.  On the washout phase of the ventilation study, there is     slight diffuse air trapping. 4. MRI, MRA of the head and neck was done, February 07, 2011, that showed     acute infarction in the right anterior cerebral artery territory,     atherosclerotic  disease in the anterior posterior cerebral arteries     bilaterally, occlusion of right anterior cerebral artery in the A2     segment.  No significant carotid or vertebral stenosis.     Orthopantogram was done February 12, 2011 that showed extensive dental     and periodontal disease.  A 2-D echo was done on February 07, 2011     that showed a left ventricular wall thickness, was increased in a     pattern of mild LVH, systolic function was moderately reduced.  EF     of 35-40%.  There was increased relative contribution of atrial     contraction to ventricular filling.  Left atrium was mildly     dilated.  A transesophageal echocardiogram was obtained on February 09, 2011 that showed a moderately-to-severely reduced systolic     function, EF of 30-35%, diffuse hypokinesis, left atrium.  No     evidence of thrombus in atrial cavity or appendage.  Negative     saline microcavitation study.  Carotid Dopplers were done on February 07, 2011 that showed no significant extracranial carotid artery     stenosis, demonstrated mild mixed plaque noted throughout     bilaterally.  Vertebrals are patent with antegrade flow.  BRIEF ADMISSION HISTORY AND PHYSICAL: [name] is a 64-year African American gentleman who developed an acute episode of left lower extremity weakness around noon on the day of admission accompanied by inability to bear weight on the left lower extremity and to walk.  The patient stated that he just lost control of his left leg suddenly.  He had no warning signs and that was not accompanied with left upper extremity weakness, slurred speech, nausea or vomiting.  The patient also does not have any chest pain, no shortness of breath, no palpitation.  The patient did have some dizziness but no loss of consciousness.  In the ED, initial CT of the head did not show an acute stroke.  EKG showed a left bundle-branch block.  There was no old EKG for comparison.  Cardiology was  called and the patient was seen and considered not to be having an acute MI. Cardiac enzymes were normal.  The patient had LFT and his alcohol level at presentation was 88.  He, however, did not have any evidence of confusion or disorientation.  The patient was diagnosed with elevated blood pressure years ago but has not been placed on any medication by any physician.  For the rest of the admission history and physical, please see H and P dictated by Dr. Phineas Douglas, job number [id number].  HOSPITAL COURSE: 1. Acute CVA.  The patient was admitted with symptoms consistent with     an acute stroke.  A stroke workup was done.  MRI which was done     with results as stated above was consistent with an acute stroke in     the right anterior cerebral artery territory.  Carotid Dopplers     were done which were negative as stated above.  A 2-D echo was done     which showed a depressed ejection fraction.  A TEE was also     obtained which was negative for any embolic source.  The patient     was placed on aspirin.  PT/OT saw the patient during the     hospitalization.  Neurology was consulted.  The patient was seen in     consultation by Dr. Thana Farr on February 07, 2011 and it was     recommended to continue the patient on aspirin.  The patient was     also placed on Crestor 20 mg daily for hyperlipidemia.  The patient     was monitored and he was followed by PT/OT during this     hospitalization.  A fasting lipid panel which was obtained during     the patient's stroke workup came back with an LDL of 81 and as such     Crestor was discontinued.  The patient was placed on blood pressure     regimen of Lopressor as well as spironolactone and initially     lisinopril for better blood pressure control.  However, the patient     did develop a facial swelling.  ACE inhibitor has been held.  The     patient's blood pressure has subsequently been stable.  The patient     is currently awaiting placement  for SNF versus inpatient rehab.     The patient is currently in stable condition. 2. Facial swelling.  On the  night of April 15 through early hours of     the 16th, the patient was noted to have developed a facial     swelling.  The patient had recently been started on an ACE     inhibitor.  There was a concern of ACE inhibitor-induced angioedema     and as such ACE inhibitor was discontinued.  The patient's sister     did mention that the patient did have some tooth pain just prior to     admission.  As such, an orthopantogram was obtained to rule out     abscess.  Orthopantogram did show extensive dental and periodontal     disease, was negative for any abscess formation.  The patient was     placed on IV steroids and placed on a steroid taper.  He was placed     on Benadryl as well as Pepcid.  The patient's facial swelling     improved.  The patient did not have any compromise of his airway.     The patient was speaking in full sentences and tolerating p.o.'s.     The patient improved clinically and he will be placed on a steroid     taper, Benadryl taper, and a Pepcid taper.  The patient's ACE     inhibitor has been discontinued and added to the list of his     allergies and this will need to be monitored and followed. 3. Cardiomyopathy/LV dysfunction.  On admission, the patient was noted     to have a left bundle-branch block on EKG.  There was no old EKG     for comparison purpose.  A cardiology consultation was obtained.     The patient was seen in consultation by Dr. Deborah Chalk on February 08, 2011 at which time a 2-D echo had been done that showed mild LVH     and an EF of 40-45%.  There was concern that the patient did have a     cardiomyopathy with unknown etiology.  There was a question as to     whether the differential did include hypertension, alcohol use, and     possible ischemia.  The patient was in normal sinus rhythm and was     asymptomatic.  It was felt this could  possibly be a nonischemic     cardiomyopathy as he did have a history of alcohol use and     hypertension.  A TEE was obtained secondary to his stroke on February 09, 2011 with the results as stated above which did show an EF of     30-35% with diffuse hypokinesis and a systolic function was     moderately-to-severely reduced.  Spironolactone was added to the     patient's blood pressure regimen.  The patient had also been     started on ACE inhibitor which was subsequently discontinued     secondary to facial swelling and concern for ACE inhibitor-induced     angioedema.  The patient was followed per Cardiology during the     hospitalization.  Also maintained on a beta-blocker and     spironolactone and aspirin.  It was recommended that the patient     will need a Lexiscan Myoview once he has recovered from his acute     stroke.  It will be deferred to Cardiology as to when this may be     done, whether it may be  done inpatient versus outpatient.  The     patient is currently in stable condition and is asymptomatic and is     currently on aspirin, a beta blocker, and spironolactone.  ACE     inhibitor was discontinued secondary to probable ACE inhibitor-     induced angioedema.  The patient is currently in stable condition. 4  Alcohol withdrawal.  During the hospitalization, the patient did have episodes of confusion as well as hallucinations.  The patient did have a history of alcohol abuse.  He was subsequently placed on Ativan, CIWA-Ar protocol, and has been improving in terms of his alcohol withdrawal and his hallucinations. 1. Extensive dental/periodontal disease.  When the patient did have     the facial swelling, his family had stated that he had some tooth     pain just prior to admission and as such there was a concern for     abscess formation.  An orthopantogram was obtained on February 12, 2011 that showed extensive dental and periodontal disease.  The     patient will  likely need to follow up with a dentist as an     outpatient once he is discharged.  May consider calling the dentist     at [facility] for further evaluation during this hospitalization,     so he could be set up and scheduled for evaluation once he is     discharged from the hospital.  The rest of the patient's issues     have remained stable.  It has been a pleasure taking care of [name].     Ramiro Harvest, MD     DT/MEDQ  D:  02/13/2011  T:  02/13/2011  Job:  [id number]  cc:   Colleen Can. Deborah Chalk, M.D. Fax: [phone]  Dr. Quinn Plowman, MD  Electronically Signed by Ramiro Harvest MD on 03/07/2011 03:04:36 PM

## 2011-03-07 NOTE — Discharge Summary (Signed)
Jim [name], Jim [name]              ACCOUNT NO.:  [id number]  MEDICAL RECORD NO.:  [id number]           PATIENT TYPE:  I  LOCATION:  3029                         FACILITY:  [facility]  PHYSICIAN:  Jim [name], M.D.     DATE OF BIRTH:  [birth year]  DATE OF ADMISSION:  02/06/2011 DATE OF DISCHARGE:                              DISCHARGE SUMMARY   PRIMARY CARE PHYSICIAN:  According to the H and P is Dr. Apolinar [name].  PRESENTING COMPLAINT:  Left leg weakness and trouble ambulating.  DISCHARGE DIAGNOSES: 1. Ischemic acute infarct in right anterior cerebral artery territory. 2. Continued confusion, possibly secondary to cerebrovascular accident     versus dementia. 3. Angioedema from lisinopril. 4. Cardiomyopathy with systolic heart failure, newly diagnosed. 5. Diabetes mellitus type 2 with A1c of 6.5. 6. Hypertension. 7. Alcohol abuse with alcohol withdrawal during hospital stay. 8. Extensive dental and periodontal disease. 9. Nicotine abuse. 10.The patient also admitted some marijuana abuse. 11.Left bundle-branch block on EKG.  DISCHARGE MEDICATIONS: 1. Aspirin 81 mg daily. 2. Famotidine 20 mg daily. 3. Folic acid 1 mg daily. 4. NovoLog 6 units subcu t.i.d. with meals. 5. Metoprolol 50 mg twice a day. 6. Multivitamins daily. 7. Nicotine patch 14 mg daily.  This should then be tapered down after     a week to 7 mg daily. 8. Nitroglycerin sublingual tab 0.4 mg every 5 minutes as needed up to     3 doses. 9. Spironolactone 25 mg daily. 10.Thiamine 100 mg daily. 11.Crestor 10 mg daily.  ALLERGIES:  ACE INHIBITORS, which result in angioedema.  CONSULTS: 1. Neuro consult with Jim Farr, MD 2. Cardiology consult with Jim Can. Jim Chalk, MD  HOSPITAL COURSE:  Please refer to progress note dictated on February 13, 2011, by Dr. Ramiro [name], job number [id number].  This is a 65 year old African American male, who developed acute left lower extremity weakness and resultant difficulty  ambulating.  The patient was brought to the ER.  In the ER, initial CT did not reveal any abnormality.  EKG revealed a left bundle-branch block without any old EKG to compare with.  The patient was also noted to have an elevated alcohol level at 88 and elevated blood pressure.  Acute CVA.  MRI of the brain performed on February 07, 2011, revealed acute infarct in the right anterior cerebral artery territory involving the anterior corpus callosum and pericallosal tissue.  MRA revealed atherosclerotic disease in the anterior posterior cerebral arteries bilaterally.  There was also occlusion of the right anterior cerebral artery in the A2 segment.  MRA of the neck reveals vertebral arteries that were widely patent.  Carotid artery patent bilaterally without significant stenosis.  The patient has undergone physical therapy and is recommended that he go to a rehab facility.  His family has chosen [name] for him.  The patient also underwent alcohol withdrawal.  Today is day 9 of his admission.  His withdrawal has resolved, but he does still have some underlying confusion, mainly this is to place.  He is able to recognize his family members and is aware that he has had a stroke.  I  am unsure if this confusion is from previous underlying dementia, alcohol abuse, or whether this is from his recent CVA.  Either way, the patient is unable to live alone and care for himself as he was prior to admission.  The patient's 2-D echo revealed a systolic heart failure, which was moderate-to-severe in the range of 30% to 35% with diffuse hypokinesis. No significant valvular abnormalities were noted.  A Cardiology consult was requested.  He was evaluated by Dr. Deborah [name].  He was placed on ACE inhibitor, spironolactone, and metoprolol.  However, he subsequently developed facial edema and ACE inhibitor was discontinued.  He was started on steroids, Benadryl, and H2 blocker with resolution of  the edema.  The patient did also have a transesophageal echo, which confirmed the EF of 30% to 35%.  No evidence of thrombus was noted in the atrial cavity or the appendage.  It is recommended by cardiology that he have an outpatient stress test.  The patient was also noted to have extensive dental and periodontal disease and also pantogram obtained on February 12, 2011, confirmed this and it is recommended for the patient to follow up with a dentist.  LABORATORY DATA:  Pertinent blood work.  The patient had a lipid profile, which showed a mildly elevated triglycerides 154, cholesterol was 197, HDL 85, and LDL 81.  A urine drug screen on admission was positive for tetrahydrocannabinol.  PHYSICAL EXAMINATION:  GENERAL:  On physical exam, the patient is awake and alert, again disoriented to place, but is aware that he is going to leave this facility today. NEUROLOGIC:  Cranial nerves II through XII intact.  Strength reveals 3/5 strength in his left lower extremity.  All other extremities revealed 5/5 strength. HEART:  Regular rate and rhythm.  No murmurs. LUNGS:  Clear bilaterally.  Normal respiratory effort.  No use of accessory muscles. ABDOMEN:  Soft, nontender, and nondistended.  Bowel sounds positive. EXTREMITIES:  No cyanosis, clubbing, or edema.  CONDITION ON DISCHARGE:  Stable.  The patient's POA has called today and I have spoken with her.  She states that she is not sure if Dr. Apolinar [name] is his primary care physician.  She states that he often sees different physicians, therefore at this point, I am not sure whom he will followup with. However in followup, the patient does need a Myoview stress test as recommended by Cardiology.  TIME SPENT:  Time on discharge today was 50 minutes.     Jim [name], M.D.     SR/MEDQ  D:  02/15/2011  T:  02/15/2011  Job:  [id number]  cc:   Dr. Apolinar [name]  Electronically Signed by Jim [name] M.D. on 03/07/2011 11:00:04 PM

## 2011-03-08 ENCOUNTER — Encounter: Payer: Self-pay | Admitting: *Deleted

## 2011-03-09 ENCOUNTER — Ambulatory Visit (INDEPENDENT_AMBULATORY_CARE_PROVIDER_SITE_OTHER): Payer: 59 | Admitting: Nurse Practitioner

## 2011-03-09 ENCOUNTER — Encounter: Payer: Self-pay | Admitting: Nurse Practitioner

## 2011-03-09 VITALS — BP 120/98 | HR 64 | Wt 178.0 lb

## 2011-03-09 DIAGNOSIS — R0609 Other forms of dyspnea: Secondary | ICD-10-CM

## 2011-03-09 DIAGNOSIS — F1910 Other psychoactive substance abuse, uncomplicated: Secondary | ICD-10-CM | POA: Insufficient documentation

## 2011-03-09 DIAGNOSIS — R0600 Dyspnea, unspecified: Secondary | ICD-10-CM

## 2011-03-09 DIAGNOSIS — I10 Essential (primary) hypertension: Secondary | ICD-10-CM

## 2011-03-09 DIAGNOSIS — I639 Cerebral infarction, unspecified: Secondary | ICD-10-CM

## 2011-03-09 DIAGNOSIS — I428 Other cardiomyopathies: Secondary | ICD-10-CM | POA: Insufficient documentation

## 2011-03-09 DIAGNOSIS — I6350 Cerebral infarction due to unspecified occlusion or stenosis of unspecified cerebral artery: Secondary | ICD-10-CM

## 2011-03-09 DIAGNOSIS — N183 Chronic kidney disease, stage 3 unspecified: Secondary | ICD-10-CM | POA: Insufficient documentation

## 2011-03-09 LAB — BASIC METABOLIC PANEL
BUN: 18 mg/dL (ref 6–23)
CO2: 22 mEq/L (ref 19–32)
Calcium: 9.7 mg/dL (ref 8.4–10.5)
Chloride: 107 mEq/L (ref 96–112)
Creatinine, Ser: 1 mg/dL (ref 0.4–1.5)
GFR: 94.04 mL/min (ref >60.00)
Glucose, Bld: 180 mg/dL — ABNORMAL HIGH (ref 70–99)
Potassium: 4.3 mEq/L (ref 3.5–5.1)
Sodium: 140 mEq/L (ref 135–145)

## 2011-03-09 LAB — BRAIN NATRIURETIC PEPTIDE: Pro B Natriuretic peptide (BNP): 94 pg/mL (ref 0.0–100.0)

## 2011-03-09 MED ORDER — HYDRALAZINE HCL 25 MG PO TABS: 25.0000 mg | ORAL_TABLET | Freq: Two times a day (BID) | ORAL | Status: AC

## 2011-03-09 MED ORDER — ISOSORBIDE MONONITRATE ER 30 MG PO TB24: 30.0000 mg | ORAL_TABLET | Freq: Every day | ORAL | Status: DC

## 2011-03-09 NOTE — Assessment & Plan Note (Addendum)
He is on low dose Lopressor and Aldactone. I have added Imdur 30 mg and Hydralazine 25 mg BID. I will see him back in about 1 month. BMET and BNP are checked today. We will see if he is a candidate for ACE. He was informed of his CHF diagnosis. His low EF is probably due to alcohol abuse and may improve with cessation. I don't think he needs a Lexiscan at this time. I would like to see a trial of medicines. If he continues to improve then we can reconsider.

## 2011-03-09 NOTE — Assessment & Plan Note (Signed)
Imdur and Hydralazine are started today. Hopefully this will help his blood pressure.

## 2011-03-09 NOTE — Patient Instructions (Signed)
Please stay on the current medicines. I have added Imdur 30 mg daily and Hydralazine 25 mg BID to his regimen I will see again in about 1 month.

## 2011-03-09 NOTE — Assessment & Plan Note (Signed)
He is told of the importance of cessation.

## 2011-03-09 NOTE — Assessment & Plan Note (Signed)
Labs are checked today.  

## 2011-03-09 NOTE — Progress Notes (Signed)
Jim [name] Date of Birth: 1946/05/16   History of Present Illness: Jim [name] is seen back today for a post hospital visit. He is seen for Dr. Deborah Chalk. He was admitted back in April with a CVA. He had an echo, showing severe LV dysfunction. He has a history of multi substance abuse. He is now in a rehab facility. He hopes to go home soon. He is currently not drinking or smoking but it is because of his environment. He did go through withdrawal while hospitalized. He continues to have issues with his left leg. It remains weak. It is difficult for him to stand. He says he is not short of breath. He has no edema. He denies chest pain. He has had some palpitations that he says are chronic. His problem list has CKD. He is not on ACE. He is only on low dose Lopressor and Aldactone.  Current Outpatient Prescriptions on File Prior to Visit  Medication Sig Dispense Refill  . aspirin 81 MG tablet Take 81 mg by mouth daily.        . famotidine (PEPCID) 20 MG tablet Take 20 mg by mouth daily.        . folic acid (FOLVITE) 1 MG tablet Take 1 mg by mouth daily.        . insulin aspart (NOVOLOG) 100 UNIT/ML injection Inject 6 Units into the skin 3 (three) times daily before meals.        Marland Kitchen LORazepam (ATIVAN) 0.5 MG tablet Take 0.5 mg by mouth every 8 (eight) hours.        . metoprolol (LOPRESSOR) 50 MG tablet Take 50 mg by mouth 2 (two) times daily.        . multivitamin (THERAGRAN) per tablet Take 1 tablet by mouth daily.        . nicotine (NICODERM CQ - DOSED IN MG/24 HR) 7 mg/24hr patch Place 1 patch onto the skin daily.        . nitroGLYCERIN (NITROSTAT) 0.4 MG SL tablet Place 0.4 mg under the tongue every 5 (five) minutes as needed.        . rosuvastatin (CRESTOR) 10 MG tablet Take 10 mg by mouth daily.        Marland Kitchen spironolactone (ALDACTONE) 25 MG tablet Take 25 mg by mouth daily.        . hydrALAZINE (APRESOLINE) 25 MG tablet Take 1 tablet (25 mg total) by mouth 2 (two) times daily.  120 tablet  11    . isosorbide mononitrate (IMDUR) 30 MG 24 hr tablet Take 1 tablet (30 mg total) by mouth daily.  30 tablet  11  . thiamine 100 MG tablet Take 100 mg by mouth daily.          Allergies  Allergen Reactions  . Lisinopril     Angioedema    Past Medical History  Diagnosis Date  . Hypertension   . Diabetes mellitus   . Kidney disease, chronic, stage III (GFR 30-59 ml/min)   . Periodontal disease   . CVA (cerebral infarction) 02/06/2011    Ischemic acute infarct in right anterior cerebral artery territory  . Confusion 02/06/2011    possibly secondary to cerebrovascular accident  versus dementia  . Angioedema 02/06/2011    lisinopril  . Cardiomyopathy 02/06/2011    Cardiomyopathy with systolic heart failure, newly diagnosed  . Alcohol abuse 02/06/2011    with alcohol withdrawal during hospital stay  . Periodontal disease     extensive  .  Tobacco abuse   . Left bundle branch block 02/06/2011  . Marijuana abuse 02/06/2011  . Nonischemic cardiomyopathy     History reviewed. No pertinent past surgical history.  History  Smoking status  . Former Smoker -- 0.5 packs/day  . Types: Cigarettes  . Quit date: 01/28/2011  Smokeless tobacco  . Not on file    History  Alcohol Use  . Yes    Moderate    Family History  Problem Relation Age of Onset  . Coronary artery disease Other     Grandparents, unspecified  . Cancer Mother   . Cancer Father     Review of Systems: The review of systems is positive for left leg weakness. No chest pain. No shortness of breath, cough or edema. He seems to be tolerating his medicines.  All other systems were reviewed and are negative.  Physical Exam: BP 120/98  Pulse 64  Wt 178 lb (80.74 kg) Patient is in no acute distress. He is in a wheelchair today. Skin is warm and dry. Color is normal.  HEENT is remarkable for facial droop. Normocephalic/atraumatic. PERRL. Sclera are nonicteric. Neck is supple. No masses. No JVD. Lungs are fairly clear.  Cardiac exam shows a regular rate and rhythm. No S3 noted. Abdomen is soft. Extremities are without edema. Gait and ROM are intact. No gross neurologic deficits are otherwise noted.  LABORATORY DATA:  Pending   Assessment / Plan:

## 2011-03-14 ENCOUNTER — Telehealth: Payer: Self-pay | Admitting: *Deleted

## 2011-03-14 NOTE — Telephone Encounter (Signed)
Message copied by Barnetta Hammersmith on Wed Mar 14, 2011  7:55 AM ------      Message from: Norma Fredrickson      Created: Fri Mar 09, 2011  4:45 PM       Patient resides at [facility] facility. Labs are ok. BNP is low.

## 2011-03-16 NOTE — Telephone Encounter (Signed)
RN faxed lab results to [facility] and [facility] at [phone].  All numbers for pt are disconnected.

## 2011-04-09 ENCOUNTER — Ambulatory Visit: Payer: 59 | Admitting: Nurse Practitioner

## 2011-04-11 ENCOUNTER — Ambulatory Visit: Payer: 59 | Admitting: Nurse Practitioner

## 2011-04-20 ENCOUNTER — Ambulatory Visit: Payer: 59 | Admitting: Nurse Practitioner

## 2013-11-05 ENCOUNTER — Encounter (HOSPITAL_COMMUNITY): Payer: Self-pay | Admitting: Emergency Medicine

## 2013-11-05 ENCOUNTER — Emergency Department (HOSPITAL_COMMUNITY)
Admission: EM | Admit: 2013-11-05 | Discharge: 2013-11-05 | Disposition: A | Discharge status: Home / Self Care | Payer: Medicare HMO | Attending: Emergency Medicine | Admitting: Emergency Medicine

## 2013-11-05 DIAGNOSIS — N183 Chronic kidney disease, stage 3 unspecified: Secondary | ICD-10-CM | POA: Insufficient documentation

## 2013-11-05 DIAGNOSIS — Z8719 Personal history of other diseases of the digestive system: Secondary | ICD-10-CM | POA: Insufficient documentation

## 2013-11-05 DIAGNOSIS — Z79899 Other long term (current) drug therapy: Secondary | ICD-10-CM | POA: Insufficient documentation

## 2013-11-05 DIAGNOSIS — Z794 Long term (current) use of insulin: Secondary | ICD-10-CM | POA: Insufficient documentation

## 2013-11-05 DIAGNOSIS — E119 Type 2 diabetes mellitus without complications: Secondary | ICD-10-CM | POA: Insufficient documentation

## 2013-11-05 DIAGNOSIS — Z8673 Personal history of transient ischemic attack (TIA), and cerebral infarction without residual deficits: Secondary | ICD-10-CM | POA: Insufficient documentation

## 2013-11-05 DIAGNOSIS — Z87891 Personal history of nicotine dependence: Secondary | ICD-10-CM | POA: Insufficient documentation

## 2013-11-05 DIAGNOSIS — R739 Hyperglycemia, unspecified: Secondary | ICD-10-CM

## 2013-11-05 DIAGNOSIS — I129 Hypertensive chronic kidney disease with stage 1 through stage 4 chronic kidney disease, or unspecified chronic kidney disease: Secondary | ICD-10-CM | POA: Insufficient documentation

## 2013-11-05 DIAGNOSIS — R300 Dysuria: Secondary | ICD-10-CM | POA: Insufficient documentation

## 2013-11-05 DIAGNOSIS — Z7982 Long term (current) use of aspirin: Secondary | ICD-10-CM | POA: Insufficient documentation

## 2013-11-05 LAB — URINALYSIS, ROUTINE W REFLEX MICROSCOPIC
BILIRUBIN URINE: NEGATIVE
Glucose, UA: >1000 mg/dL — AB
Hgb urine dipstick: NEGATIVE
KETONES UR: 15 mg/dL — AB
Leukocytes, UA: NEGATIVE
NITRITE: NEGATIVE
PROTEIN: NEGATIVE mg/dL
Specific Gravity, Urine: 1.041 — ABNORMAL HIGH (ref 1.005–1.030)
Urobilinogen, UA: 0.2 mg/dL (ref 0.0–1.0)
pH: 5.5 (ref 5.0–8.0)

## 2013-11-05 LAB — CBC
HEMATOCRIT: 42 % (ref 39.0–52.0)
HEMOGLOBIN: 14.6 g/dL (ref 13.0–17.0)
MCH: 31.5 pg (ref 26.0–34.0)
MCHC: 34.8 g/dL (ref 30.0–36.0)
MCV: 90.5 fL (ref 78.0–100.0)
Platelets: 218 10*3/uL (ref 150–400)
RBC: 4.64 MIL/uL (ref 4.22–5.81)
RDW: 12.6 % (ref 11.5–15.5)
WBC: 12 10*3/uL — AB (ref 4.0–10.5)

## 2013-11-05 LAB — GLUCOSE, CAPILLARY
GLUCOSE-CAPILLARY: 378 mg/dL — AB (ref 70–99)
GLUCOSE-CAPILLARY: 306 mg/dL — AB (ref 70–99)
Glucose-Capillary: 147 mg/dL — ABNORMAL HIGH (ref 70–99)

## 2013-11-05 LAB — COMPREHENSIVE METABOLIC PANEL
ALK PHOS: 73 U/L (ref 39–117)
ALT: 19 U/L (ref 0–53)
AST: 15 U/L (ref 0–37)
Albumin: 4.5 g/dL (ref 3.5–5.2)
BILIRUBIN TOTAL: 0.5 mg/dL (ref 0.3–1.2)
BUN: 17 mg/dL (ref 6–23)
CHLORIDE: 99 meq/L (ref 96–112)
CO2: 23 meq/L (ref 19–32)
Calcium: 10.1 mg/dL (ref 8.4–10.5)
Creatinine, Ser: 1 mg/dL (ref 0.50–1.35)
GFR calc non Af Amer: 75 mL/min — ABNORMAL LOW (ref >90)
GFR, EST AFRICAN AMERICAN: 87 mL/min — AB (ref >90)
GLUCOSE: 423 mg/dL — AB (ref 70–99)
POTASSIUM: 4.3 meq/L (ref 3.7–5.3)
SODIUM: 136 meq/L — AB (ref 137–147)
Total Protein: 7.4 g/dL (ref 6.0–8.3)

## 2013-11-05 LAB — URINE MICROSCOPIC-ADD ON

## 2013-11-05 MED ORDER — SODIUM CHLORIDE 0.9 % IV BOLUS (SEPSIS)
1000.0000 mL | Freq: Once | INTRAVENOUS | Status: AC
  Administered 2013-11-05: 1000 mL via INTRAVENOUS

## 2013-11-05 MED ORDER — INSULIN ASPART 100 UNIT/ML ~~LOC~~ SOLN
10.0000 [IU] | Freq: Once | SUBCUTANEOUS | Status: AC
  Administered 2013-11-05: 10 [IU] via INTRAVENOUS

## 2013-11-05 NOTE — ED Notes (Signed)
Per EMS, transferred from doctor's office, hyperglycemia, CBG 380, states increased urine output per patient, saw dr due to visual disturbances related to medication, CBG in dr's office was over 500

## 2013-11-05 NOTE — Discharge Instructions (Signed)

## 2013-11-05 NOTE — ED Notes (Addendum)
Pt states went to doctor because not feeling well, states taking his Metformin as directed, has not been checking blood sugar, states burning on urination, c/o frequent urination

## 2013-11-05 NOTE — ED Notes (Signed)
Bed: WA01 Expected date:  Expected time:  Means of arrival:  Comments: EMS

## 2013-11-05 NOTE — ED Provider Notes (Addendum)
CSN: [id number]     Arrival date & time 11/05/13  1429 History   First MD Initiated Contact with Patient 11/05/13 1507     Chief Complaint  Patient presents with  . Hyperglycemia   (Consider location/radiation/quality/duration/timing/severity/associated sxs/prior Treatment) Patient is a 68 y.o. male presenting with hyperglycemia. The history is provided by the patient.  Hyperglycemia Blood sugar level PTA:  380 Severity:  Moderate Onset quality:  Gradual Duration:  1 week Timing:  Constant Progression:  Worsening Chronicity:  New Diabetes status:  Controlled with oral medications Current diabetic therapy:  Metformin Context: recent change in diet   Context: not change in medication, not new diabetes diagnosis and not noncompliance   Context comment:  Has not been on his usual diet since the holidays and also got a cortisone injection in his left knee about 1 week ago before sx started Relieved by:  None tried Ineffective treatments:  Oral agents Associated symptoms: blurred vision, dysuria, increased thirst and polyuria   Associated symptoms: no abdominal pain, no altered mental status, no chest pain, no confusion, no diaphoresis, no fever, no nausea and no shortness of breath   Associated symptoms comment:  Palpitations Risk factors: recent steroid use   Risk factors: no hx of DKA and no pancreatic disease     Past Medical History  Diagnosis Date  . Hypertension     Mild LVH per echo  . Diabetes mellitus   . Kidney disease, chronic, stage III (GFR 30-59 ml/min)   . Periodontal disease   . CVA (cerebral infarction) 02/06/2011    Ischemic acute infarct in right anterior cerebral artery territory  . Confusion 02/06/2011    possibly secondary to cerebrovascular accident  versus dementia  . Angioedema 02/06/2011    lisinopril  . Cardiomyopathy 02/06/2011    Cardiomyopathy with systolic heart failure, newly diagnosed  . Alcohol abuse 02/06/2011    with alcohol withdrawal during  hospital stay  . Periodontal disease     extensive  . Tobacco abuse   . Left bundle branch block 02/06/2011  . Marijuana abuse 02/06/2011  . Nonischemic cardiomyopathy     EF is 30 to 35%   Past Surgical History  Procedure Laterality Date  . US echocardiography  April 2012    EF 30 to 35 %   Family History  Problem Relation Age of Onset  . Coronary artery disease Other     Grandparents, unspecified  . Cancer Mother   . Cancer Father    History  Substance Use Topics  . Smoking status: Former Smoker -- 0.50 packs/day    Types: Cigarettes    Quit date: 01/28/2011  . Smokeless tobacco: Not on file  . Alcohol Use: Yes     Comment: Moderate    Review of Systems  Constitutional: Negative for fever and diaphoresis.  Eyes: Positive for blurred vision.  Respiratory: Negative for shortness of breath.   Cardiovascular: Negative for chest pain.  Gastrointestinal: Negative for nausea and abdominal pain.  Endocrine: Positive for polydipsia and polyuria.  Genitourinary: Positive for dysuria.  Psychiatric/Behavioral: Negative for confusion.  All other systems reviewed and are negative.    Allergies  Lisinopril  Home Medications   Current Outpatient Rx  Name  Route  Sig  Dispense  Refill  . aspirin 81 MG tablet   Oral   Take 81 mg by mouth daily.           . famotidine (PEPCID) 20 MG tablet   Oral  Take 20 mg by mouth daily.           . folic acid (FOLVITE) 1 MG tablet   Oral   Take 1 mg by mouth daily.           Marland Kitchen EXPIRED: hydrALAZINE (APRESOLINE) 25 MG tablet   Oral   Take 1 tablet (25 mg total) by mouth 2 (two) times daily.   120 tablet   11   . insulin aspart (NOVOLOG) 100 UNIT/ML injection   Subcutaneous   Inject 6 Units into the skin 3 (three) times daily before meals.           Marland Kitchen EXPIRED: isosorbide mononitrate (IMDUR) 30 MG 24 hr tablet   Oral   Take 1 tablet (30 mg total) by mouth daily.   30 tablet   11   . LORazepam (ATIVAN) 0.5 MG  tablet   Oral   Take 0.5 mg by mouth every 8 (eight) hours.           . metoprolol (LOPRESSOR) 50 MG tablet   Oral   Take 50 mg by mouth 2 (two) times daily.           . multivitamin (THERAGRAN) per tablet   Oral   Take 1 tablet by mouth daily.           . nicotine (NICODERM CQ - DOSED IN MG/24 HR) 7 mg/24hr patch   Transdermal   Place 1 patch onto the skin daily.           . nitroGLYCERIN (NITROSTAT) 0.4 MG SL tablet   Sublingual   Place 0.4 mg under the tongue every 5 (five) minutes as needed.           . rosuvastatin (CRESTOR) 10 MG tablet   Oral   Take 10 mg by mouth daily.           Marland Kitchen spironolactone (ALDACTONE) 25 MG tablet   Oral   Take 25 mg by mouth daily.           Marland Kitchen thiamine 100 MG tablet   Oral   Take 100 mg by mouth daily.            BP 130/88  Pulse 99  Temp(Src) 97.7 F (36.5 C) (Oral)  Resp 16  SpO2 100% Physical Exam  Nursing note and vitals reviewed. Constitutional: He is oriented to person, place, and time. He appears well-developed and well-nourished. No distress.  HENT:  Head: Normocephalic and atraumatic.  Mouth/Throat: Oropharynx is clear and moist.  Eyes: Conjunctivae and EOM are normal. Pupils are equal, round, and reactive to light.  Neck: Normal range of motion. Neck supple.  Cardiovascular: Normal rate, regular rhythm and intact distal pulses.   No murmur heard. Pulmonary/Chest: Effort normal and breath sounds normal. No respiratory distress. He has no wheezes. He has no rales.  Abdominal: Soft. He exhibits no distension. There is no tenderness. There is no rebound and no guarding.  Musculoskeletal: Normal range of motion. He exhibits no edema and no tenderness.  Neurological: He is alert and oriented to person, place, and time.  Skin: Skin is warm and dry. No rash noted. No erythema.  Psychiatric: He has a normal mood and affect. His behavior is normal.    ED Course  Procedures (including critical care time) Labs  Review Labs Reviewed  GLUCOSE, CAPILLARY - Abnormal; Notable for the following:    Glucose-Capillary 378 (*)    All other components within normal limits  CBC - Abnormal; Notable for the following:    WBC 12.0 (*)    All other components within normal limits  COMPREHENSIVE METABOLIC PANEL - Abnormal; Notable for the following:    Sodium 136 (*)    Glucose, Bld 423 (*)    GFR calc non Af Amer 75 (*)    GFR calc Af Amer 87 (*)    All other components within normal limits  URINALYSIS, ROUTINE W REFLEX MICROSCOPIC - Abnormal; Notable for the following:    Specific Gravity, Urine 1.041 (*)    Glucose, UA >1000 (*)    Ketones, ur 15 (*)    All other components within normal limits  URINE MICROSCOPIC-ADD ON   Imaging Review No results found.  EKG Interpretation   None       MDM   1. Hyperglycemia     Pt with hx of cortisone shot in the left knee last week and this week he has had palpitations, blurry vision, polydipsia and polyuria.  Found to have BS in the 400's today and PCP office and sent here for further care.  Pt takes metformin bid and has continued but admits to not following a diabetic diet and the combination with steroid shot is the cause of his hyperglycemia.  No neuro deficits and pt o/w well appearing.    No abd pain, fever, URI or cardiac sx currently.  States he had palpitations yesterday but normal heart rate and BP today.  CBC with mild leukocytosis which is most likely from steroids.  UA with glucose without infection.  CMP pending. Pt hydrated and most likely will require insulin for BS control.  Pt's typical BS is in the 100's.    3:58 PM CMP with hyperglycemia but o/w normal Cr.    6:50 PM After 2L of fluid and insulin BS is 147.  Pt feels much better.  Will d/c home.  Blanchie Dessert, MD 11/05/13 [place], MD 11/05/13 6712

## 2013-11-06 ENCOUNTER — Emergency Department (HOSPITAL_COMMUNITY)
Admission: EM | Admit: 2013-11-06 | Discharge: 2013-11-07 | Disposition: A | Discharge status: Home / Self Care | Payer: Medicare HMO | Attending: Emergency Medicine | Admitting: Emergency Medicine

## 2013-11-06 ENCOUNTER — Encounter (HOSPITAL_COMMUNITY): Payer: Self-pay | Admitting: Emergency Medicine

## 2013-11-06 DIAGNOSIS — R5383 Other fatigue: Secondary | ICD-10-CM

## 2013-11-06 DIAGNOSIS — Z87891 Personal history of nicotine dependence: Secondary | ICD-10-CM | POA: Insufficient documentation

## 2013-11-06 DIAGNOSIS — M129 Arthropathy, unspecified: Secondary | ICD-10-CM | POA: Insufficient documentation

## 2013-11-06 DIAGNOSIS — N183 Chronic kidney disease, stage 3 unspecified: Secondary | ICD-10-CM | POA: Insufficient documentation

## 2013-11-06 DIAGNOSIS — Z7982 Long term (current) use of aspirin: Secondary | ICD-10-CM | POA: Insufficient documentation

## 2013-11-06 DIAGNOSIS — I129 Hypertensive chronic kidney disease with stage 1 through stage 4 chronic kidney disease, or unspecified chronic kidney disease: Secondary | ICD-10-CM | POA: Insufficient documentation

## 2013-11-06 DIAGNOSIS — R739 Hyperglycemia, unspecified: Secondary | ICD-10-CM

## 2013-11-06 DIAGNOSIS — R5381 Other malaise: Secondary | ICD-10-CM | POA: Insufficient documentation

## 2013-11-06 DIAGNOSIS — Z79899 Other long term (current) drug therapy: Secondary | ICD-10-CM | POA: Insufficient documentation

## 2013-11-06 DIAGNOSIS — Z8719 Personal history of other diseases of the digestive system: Secondary | ICD-10-CM | POA: Insufficient documentation

## 2013-11-06 DIAGNOSIS — E119 Type 2 diabetes mellitus without complications: Secondary | ICD-10-CM | POA: Insufficient documentation

## 2013-11-06 LAB — BASIC METABOLIC PANEL
BUN: 13 mg/dL (ref 6–23)
CHLORIDE: 99 meq/L (ref 96–112)
CO2: 22 mEq/L (ref 19–32)
Calcium: 9.9 mg/dL (ref 8.4–10.5)
Creatinine, Ser: 1 mg/dL (ref 0.50–1.35)
GFR calc Af Amer: 87 mL/min — ABNORMAL LOW (ref >90)
GFR calc non Af Amer: 75 mL/min — ABNORMAL LOW (ref >90)
Glucose, Bld: 351 mg/dL — ABNORMAL HIGH (ref 70–99)
Potassium: 4.1 mEq/L (ref 3.7–5.3)
Sodium: 136 mEq/L — ABNORMAL LOW (ref 137–147)

## 2013-11-06 LAB — CBC
HCT: 41.4 % (ref 39.0–52.0)
Hemoglobin: 14 g/dL (ref 13.0–17.0)
MCH: 30.4 pg (ref 26.0–34.0)
MCHC: 33.8 g/dL (ref 30.0–36.0)
MCV: 90 fL (ref 78.0–100.0)
PLATELETS: 201 10*3/uL (ref 150–400)
RBC: 4.6 MIL/uL (ref 4.22–5.81)
RDW: 12.5 % (ref 11.5–15.5)
WBC: 8.7 10*3/uL (ref 4.0–10.5)

## 2013-11-06 LAB — GLUCOSE, CAPILLARY
GLUCOSE-CAPILLARY: 252 mg/dL — AB (ref 70–99)
Glucose-Capillary: 381 mg/dL — ABNORMAL HIGH (ref 70–99)
Glucose-Capillary: 264 mg/dL — ABNORMAL HIGH (ref 70–99)

## 2013-11-06 LAB — URINALYSIS, ROUTINE W REFLEX MICROSCOPIC
Bilirubin Urine: NEGATIVE
Glucose, UA: >1000 mg/dL — AB
Hgb urine dipstick: NEGATIVE
Ketones, ur: 15 mg/dL — AB
Leukocytes, UA: NEGATIVE
Nitrite: NEGATIVE
Protein, ur: NEGATIVE mg/dL
Specific Gravity, Urine: 1.03 (ref 1.005–1.030)
Urobilinogen, UA: 0.2 mg/dL (ref 0.0–1.0)
pH: 5 (ref 5.0–8.0)

## 2013-11-06 LAB — URINE MICROSCOPIC-ADD ON

## 2013-11-06 MED ORDER — INSULIN ASPART 100 UNIT/ML ~~LOC~~ SOLN
5.0000 [IU] | Freq: Once | SUBCUTANEOUS | Status: AC
  Administered 2013-11-06: 5 [IU] via SUBCUTANEOUS
  Filled 2013-11-06: qty 1

## 2013-11-06 MED ORDER — SODIUM CHLORIDE 0.9 % IV SOLN
1000.0000 mL | INTRAVENOUS | Status: DC
  Administered 2013-11-06: 1000 mL via INTRAVENOUS

## 2013-11-06 MED ORDER — SODIUM CHLORIDE 0.9 % IV SOLN
1000.0000 mL | Freq: Once | INTRAVENOUS | Status: AC
  Administered 2013-11-06: 1000 mL via INTRAVENOUS

## 2013-11-06 NOTE — ED Notes (Signed)
Pt states he was here last night. Today his blood sugar has been reading 550 today. Pt states that he hasnt eaten since this morning. Pt has taken his metformin and his PCP advised him to come back to EDE with that high blood sugar, dizzy and blurred vision.

## 2013-11-06 NOTE — ED Provider Notes (Signed)
CSN: [id number]     Arrival date & time 11/06/13  1835 History   First MD Initiated Contact with Patient 11/06/13 1911     Chief Complaint  Patient presents with  . Hyperglycemia   (Consider location/radiation/quality/duration/timing/severity/associated sxs/prior Treatment) Patient is a 68 y.o. male presenting with hyperglycemia. The history is provided by the patient and medical records. No language interpreter was used.  Hyperglycemia Associated symptoms: increased thirst and polyuria   Associated symptoms: no abdominal pain, no chest pain, no diaphoresis, no dysuria, no fatigue, no fever, no nausea, no shortness of breath and no vomiting     Jim [name] is a 68 y.o. male  with a hx of NIDDM, CKD, HTN, arthritis,  presents to the Emergency Department complaining of gradual, persistent, progressively worsening hyperglycemia with associated weakness, polydipsia, polyuria, blurred vision onset midafternoon approximately 2 PM.  Patient reports he was seen here yesterday for the same. He reports receiving a cortisone shot in the left knee last week. He states he takes metformin 500 mg twice a day for his hyperglycemia.  He reports several days of blurred vision, palpitations, polydipsia polyuria before presenting to the primary care office yesterday. He was then sent here from the primary care office with blood sugars in the 400s.  He reports that he doesn't follow a diabetic diet and ate several things with sugar in them today.   Patient reports he awoke today feeling well with a blood sugar in the 300s. When he began to experience recurrent symptoms approximately 2 PM he checked his blood sugar and found it to be 552. His primary care recommended that he come here. Patient reports he took his nightly metformin dose prior to arrival.  Past Medical History  Diagnosis Date  . Hypertension     Mild LVH per echo  . Diabetes mellitus   . Kidney disease, chronic, stage III (GFR 30-59 ml/min)   .  Periodontal disease   . CVA (cerebral infarction) 02/06/2011    Ischemic acute infarct in right anterior cerebral artery territory  . Confusion 02/06/2011    possibly secondary to cerebrovascular accident  versus dementia  . Angioedema 02/06/2011    lisinopril  . Cardiomyopathy 02/06/2011    Cardiomyopathy with systolic heart failure, newly diagnosed  . Alcohol abuse 02/06/2011    with alcohol withdrawal during hospital stay  . Periodontal disease     extensive  . Tobacco abuse   . Left bundle branch block 02/06/2011  . Marijuana abuse 02/06/2011  . Nonischemic cardiomyopathy     EF is 30 to 35%   Past Surgical History  Procedure Laterality Date  . US echocardiography  April 2012    EF 30 to 35 %   Family History  Problem Relation Age of Onset  . Coronary artery disease Other     Grandparents, unspecified  . Cancer Mother   . Cancer Father    History  Substance Use Topics  . Smoking status: Former Smoker -- 0.50 packs/day    Types: Cigarettes    Quit date: 01/28/2011  . Smokeless tobacco: Not on file  . Alcohol Use: Yes     Comment: Moderate    Review of Systems  Constitutional: Negative for fever, diaphoresis, appetite change, fatigue and unexpected weight change.  HENT: Negative for mouth sores.   Eyes: Positive for visual disturbance.  Respiratory: Negative for cough, chest tightness, shortness of breath and wheezing.   Cardiovascular: Negative for chest pain.  Gastrointestinal: Negative for nausea, vomiting,  abdominal pain, diarrhea and constipation.  Endocrine: Positive for polydipsia and polyuria. Negative for polyphagia.  Genitourinary: Negative for dysuria, urgency, frequency and hematuria.  Musculoskeletal: Negative for back pain and neck stiffness.  Skin: Negative for rash.  Allergic/Immunologic: Negative for immunocompromised state.  Neurological: Positive for weakness. Negative for syncope, light-headedness and headaches.  Hematological: Does not  bruise/bleed easily.  Psychiatric/Behavioral: Negative for sleep disturbance. The patient is not nervous/anxious.     Allergies  Lisinopril  Home Medications   Current Outpatient Rx  Name  Route  Sig  Dispense  Refill  . aspirin 81 MG tablet   Oral   Take 81 mg by mouth daily.           Marland Kitchen buPROPion (WELLBUTRIN SR) 100 MG 12 hr tablet   Oral   Take 100 mg by mouth 2 (two) times daily.         . clopidogrel (PLAVIX) 75 MG tablet   Oral   Take 75 mg by mouth daily with breakfast.         . EXPIRED: hydrALAZINE (APRESOLINE) 25 MG tablet   Oral   Take 1 tablet (25 mg total) by mouth 2 (two) times daily.   120 tablet   11   . hydrochlorothiazide (HYDRODIURIL) 25 MG tablet   Oral   Take 25 mg by mouth daily.         Marland Kitchen EXPIRED: isosorbide mononitrate (IMDUR) 30 MG 24 hr tablet   Oral   Take 1 tablet (30 mg total) by mouth daily.   30 tablet   11   . metFORMIN (GLUCOPHAGE) 500 MG tablet   Oral   Take 500 mg by mouth 2 (two) times daily with a meal.         . multivitamin (THERAGRAN) per tablet   Oral   Take 1 tablet by mouth daily.           . rosuvastatin (CRESTOR) 10 MG tablet   Oral   Take 10 mg by mouth daily.           . traMADol (ULTRAM) 50 MG tablet   Oral   Take 50 mg by mouth every 8 (eight) hours as needed for moderate pain.         . nitroGLYCERIN (NITROSTAT) 0.4 MG SL tablet   Sublingual   Place 0.4 mg under the tongue every 5 (five) minutes as needed.            BP 129/82  Pulse 72  Temp(Src) 97.5 F (36.4 C) (Oral)  Resp 14  SpO2 100% Physical Exam  Nursing note and vitals reviewed. Constitutional: He is oriented to person, place, and time. He appears well-developed and well-nourished. No distress.  Awake, alert, nontoxic appearance  HENT:  Head: Normocephalic and atraumatic.  Mouth/Throat: Oropharynx is clear and moist. No oropharyngeal exudate.  Eyes: Conjunctivae and EOM are normal. Pupils are equal, round, and  reactive to light. No scleral icterus.  Neck: Normal range of motion. Neck supple.  Cardiovascular: Normal rate, regular rhythm, normal heart sounds and intact distal pulses.   No murmur heard. No tachycardia  Pulmonary/Chest: Effort normal and breath sounds normal. No respiratory distress. He has no wheezes. He has no rales.  Abdominal: Soft. Bowel sounds are normal. He exhibits no mass. There is no tenderness. There is no rebound and no guarding.  Abdomen soft and nontender  Musculoskeletal: Normal range of motion. He exhibits no edema.  Left knee without erythema, induration or  increased warmth  Lymphadenopathy:    He has no cervical adenopathy.  Neurological: He is alert and oriented to person, place, and time. He has normal reflexes. No cranial nerve deficit. He exhibits normal muscle tone. Coordination normal.  Speech is clear and goal oriented, follows commands Cranial nerves III - XII without deficit, no facial droop Normal strength in upper and lower extremities bilaterally, strong and equal grip strength Sensation normal to light and sharp touch Moves extremities without ataxia, coordination intact Normal finger to nose and rapid alternating movements Neg romberg, no pronator drift Normal gait Normal heel-shin and balance   Skin: Skin is warm and dry. No rash noted. He is not diaphoretic. No erythema.  Psychiatric: He has a normal mood and affect. His behavior is normal. Judgment and thought content normal.    ED Course  Procedures (including critical care time) Labs Review Labs Reviewed  GLUCOSE, CAPILLARY - Abnormal; Notable for the following:    Glucose-Capillary 381 (*)    All other components within normal limits  BASIC METABOLIC PANEL - Abnormal; Notable for the following:    Sodium 136 (*)    Glucose, Bld 351 (*)    GFR calc non Af Amer 75 (*)    GFR calc Af Amer 87 (*)    All other components within normal limits  URINALYSIS, ROUTINE W REFLEX MICROSCOPIC -  Abnormal; Notable for the following:    Glucose, UA >1000 (*)    Ketones, ur 15 (*)    All other components within normal limits  GLUCOSE, CAPILLARY - Abnormal; Notable for the following:    Glucose-Capillary 264 (*)    All other components within normal limits  GLUCOSE, CAPILLARY - Abnormal; Notable for the following:    Glucose-Capillary 252 (*)    All other components within normal limits  GLUCOSE, CAPILLARY - Abnormal; Notable for the following:    Glucose-Capillary 127 (*)    All other components within normal limits  CBC  URINE MICROSCOPIC-ADD ON   Imaging Review No results found.  EKG Interpretation   None       MDM   1. Hyperglycemia      Lorne Skeens presents with hyperglycemia. CBG 381 arrival.  Patient was seen for the same yesterday given fluids, subcutaneous insulin with decrease in blood sugars and resolution of symptoms. Patient reports his blood sugar usually runs 105-120.  Will check labs, fluids and reassess. Pt without neurologic deficit or focal neurologic finding.    8:45 PM UA with large amount of glucose and small ketones.  Anion gap 15; expect this will close with fluids and insulin.  CBC unremarkable, BMP otherwise unremarkable. No elevations in BUN and creatinine.  11:18 PM Pt feeling much better with resolution of blurred vision.  Patient ambulates without difficulty. He remains without focal neurologic deficit. Repeat CBG.  Will d/c home with recommendation to increase metformin to 1000mg  BID for the next week until re-evaluation by PCP.    12:58 AM Repeat CBG 127.  Pt with complete resolution of symptoms at this time.    It has been determined that no acute conditions requiring further emergency intervention are present at this time. The patient/guardian have been advised of the diagnosis and plan. We have discussed signs and symptoms that warrant return to the ED, such as changes or worsening in symptoms.   Vital signs are stable at  discharge.   BP 129/82  Pulse 72  Temp(Src) 97.5 F (36.4 C) (Oral)  Resp 14  SpO2 100%  Patient/guardian has voiced understanding and agreed to follow-up with the PCP or specialist.      Abigail Butts, PA-C 11/07/13 [phone]

## 2013-11-07 LAB — GLUCOSE, CAPILLARY: Glucose-Capillary: 127 mg/dL — ABNORMAL HIGH (ref 70–99)

## 2013-11-07 NOTE — Discharge Instructions (Signed)
1. Medications: usual home medications - increase your metformin to 1000mg  twice per day (2 tablets twice per day) 2. Treatment: rest, drink plenty of fluids,  3. Follow Up: Please followup with your primary doctor for discussion of your diagnoses and further evaluation after today's visit; if you do not have a primary care doctor use the resource guide provided to find one;     Hyperglycemia Hyperglycemia occurs when the glucose (sugar) in your blood is too high. Hyperglycemia can happen for many reasons, but it most often happens to people who do not know they have diabetes or are not managing their diabetes properly.  CAUSES  Whether you have diabetes or not, there are other causes of hyperglycemia. Hyperglycemia can occur when you have diabetes, but it can also occur in other situations that you might not be as aware of, such as: Diabetes  If you have diabetes and are having problems controlling your blood glucose, hyperglycemia could occur because of some of the following reasons:  Not following your meal plan.  Not taking your diabetes medications or not taking it properly.  Exercising less or doing less activity than you normally do.  Being sick. Pre-diabetes  This cannot be ignored. Before people develop Type 2 diabetes, they almost always have "pre-diabetes." This is when your blood glucose levels are higher than normal, but not yet high enough to be diagnosed as diabetes. Research has shown that some long-term damage to the body, especially the heart and circulatory system, may already be occurring during pre-diabetes. If you take action to manage your blood glucose when you have pre-diabetes, you may delay or prevent Type 2 diabetes from developing. Stress  If you have diabetes, you may be "diet" controlled or on oral medications or insulin to control your diabetes. However, you may find that your blood glucose is higher than usual in the hospital whether you have diabetes or  not. This is often referred to as "stress hyperglycemia." Stress can elevate your blood glucose. This happens because of hormones put out by the body during times of stress. If stress has been the cause of your high blood glucose, it can be followed regularly by your caregiver. That way he/she can make sure your hyperglycemia does not continue to get worse or progress to diabetes. Steroids  Steroids are medications that act on the infection fighting system (immune system) to block inflammation or infection. One side effect can be a rise in blood glucose. Most people can produce enough extra insulin to allow for this rise, but for those who cannot, steroids make blood glucose levels go even higher. It is not unusual for steroid treatments to "uncover" diabetes that is developing. It is not always possible to determine if the hyperglycemia will go away after the steroids are stopped. A special blood test called an A1c is sometimes done to determine if your blood glucose was elevated before the steroids were started. SYMPTOMS  Thirsty.  Frequent urination.  Dry mouth.  Blurred vision.  Tired or fatigue.  Weakness.  Sleepy.  Tingling in feet or leg. DIAGNOSIS  Diagnosis is made by monitoring blood glucose in one or all of the following ways:  A1c test. This is a chemical found in your blood.  Fingerstick blood glucose monitoring.  Laboratory results. TREATMENT  First, knowing the cause of the hyperglycemia is important before the hyperglycemia can be treated. Treatment may include, but is not be limited to:  Education.  Change or adjustment in medications.  Change  or adjustment in meal plan.  Treatment for an illness, infection, etc.  More frequent blood glucose monitoring.  Change in exercise plan.  Decreasing or stopping steroids.  Lifestyle changes. HOME CARE INSTRUCTIONS   Test your blood glucose as directed.  Exercise regularly. Your caregiver will give you  instructions about exercise. Pre-diabetes or diabetes which comes on with stress is helped by exercising.  Eat wholesome, balanced meals. Eat often and at regular, fixed times. Your caregiver or nutritionist will give you a meal plan to guide your sugar intake.  Being at an ideal weight is important. If needed, losing as little as 10 to 15 pounds may help improve blood glucose levels. SEEK MEDICAL CARE IF:   You have questions about medicine, activity, or diet.  You continue to have symptoms (problems such as increased thirst, urination, or weight gain). SEEK IMMEDIATE MEDICAL CARE IF:   You are vomiting or have diarrhea.  Your breath smells fruity.  You are breathing faster or slower.  You are very sleepy or incoherent.  You have numbness, tingling, or pain in your feet or hands.  You have chest pain.  Your symptoms get worse even though you have been following your caregiver's orders.  If you have any other questions or concerns. Document Released: 04/10/2001 Document Revised: 01/07/2012 Document Reviewed: 02/11/2012 [facility] Patient Information 2014 [place], [place].

## 2013-11-07 NOTE — ED Provider Notes (Signed)
Medical screening examination/treatment/procedure(s) were conducted as a shared visit with non-physician practitioner(s) and myself.  I personally evaluated the patient during the encounter.  EKG Interpretation   None       Pt with hx diabetes, hyperglycemia. Labs. Ivf. abd soft nt. Tolerating po.   Mirna Mires, MD 11/07/13 [phone]

## 2013-11-27 ENCOUNTER — Ambulatory Visit: Payer: PRIVATE HEALTH INSURANCE | Admitting: Cardiovascular Disease

## 2013-12-03 ENCOUNTER — Telehealth: Payer: Self-pay

## 2013-12-15 ENCOUNTER — Ambulatory Visit: Payer: PRIVATE HEALTH INSURANCE | Admitting: Cardiovascular Disease

## 2014-02-10 ENCOUNTER — Telehealth: Payer: Self-pay

## 2014-12-25 ENCOUNTER — Encounter (HOSPITAL_COMMUNITY): Payer: Self-pay | Admitting: Emergency Medicine

## 2014-12-25 ENCOUNTER — Emergency Department (HOSPITAL_COMMUNITY)
Admission: EM | Admit: 2014-12-25 | Discharge: 2014-12-26 | Disposition: A | Discharge status: Home / Self Care | Payer: Medicare HMO | Attending: Emergency Medicine | Admitting: Emergency Medicine

## 2014-12-25 DIAGNOSIS — T783XXA Angioneurotic edema, initial encounter: Secondary | ICD-10-CM

## 2014-12-25 DIAGNOSIS — Z79899 Other long term (current) drug therapy: Secondary | ICD-10-CM | POA: Insufficient documentation

## 2014-12-25 DIAGNOSIS — I129 Hypertensive chronic kidney disease with stage 1 through stage 4 chronic kidney disease, or unspecified chronic kidney disease: Secondary | ICD-10-CM | POA: Diagnosis not present

## 2014-12-25 DIAGNOSIS — Z8719 Personal history of other diseases of the digestive system: Secondary | ICD-10-CM | POA: Insufficient documentation

## 2014-12-25 DIAGNOSIS — Z7982 Long term (current) use of aspirin: Secondary | ICD-10-CM | POA: Insufficient documentation

## 2014-12-25 DIAGNOSIS — Z794 Long term (current) use of insulin: Secondary | ICD-10-CM | POA: Insufficient documentation

## 2014-12-25 DIAGNOSIS — N183 Chronic kidney disease, stage 3 (moderate): Secondary | ICD-10-CM | POA: Insufficient documentation

## 2014-12-25 DIAGNOSIS — Z8673 Personal history of transient ischemic attack (TIA), and cerebral infarction without residual deficits: Secondary | ICD-10-CM | POA: Insufficient documentation

## 2014-12-25 DIAGNOSIS — Z7902 Long term (current) use of antithrombotics/antiplatelets: Secondary | ICD-10-CM | POA: Diagnosis not present

## 2014-12-25 DIAGNOSIS — E119 Type 2 diabetes mellitus without complications: Secondary | ICD-10-CM | POA: Diagnosis not present

## 2014-12-25 DIAGNOSIS — R220 Localized swelling, mass and lump, head: Secondary | ICD-10-CM | POA: Diagnosis present

## 2014-12-25 DIAGNOSIS — Z87891 Personal history of nicotine dependence: Secondary | ICD-10-CM | POA: Insufficient documentation

## 2014-12-25 LAB — CBG MONITORING, ED: Glucose-Capillary: 116 mg/dL — ABNORMAL HIGH (ref 70–99)

## 2014-12-25 MED ORDER — PREDNISONE 50 MG PO TABS: 50.0000 mg | ORAL_TABLET | Freq: Every day | ORAL | Status: DC

## 2014-12-25 MED ORDER — FAMOTIDINE 20 MG PO TABS: 20.0000 mg | ORAL_TABLET | Freq: Two times a day (BID) | ORAL | Status: AC

## 2014-12-25 MED ORDER — DIPHENHYDRAMINE HCL 25 MG PO TABS: 25.0000 mg | ORAL_TABLET | Freq: Four times a day (QID) | ORAL | Status: AC

## 2014-12-25 MED ORDER — FAMOTIDINE IN NACL 20-0.9 MG/50ML-% IV SOLN
20.0000 mg | Freq: Once | INTRAVENOUS | Status: AC
  Administered 2014-12-25: 20 mg via INTRAVENOUS
  Filled 2014-12-25: qty 50

## 2014-12-25 MED ORDER — METHYLPREDNISOLONE SODIUM SUCC 125 MG IJ SOLR
125.0000 mg | Freq: Once | INTRAMUSCULAR | Status: AC
  Administered 2014-12-25: 125 mg via INTRAVENOUS
  Filled 2014-12-25: qty 2

## 2014-12-25 MED ORDER — DIPHENHYDRAMINE HCL 50 MG/ML IJ SOLN
25.0000 mg | Freq: Once | INTRAMUSCULAR | Status: AC
  Administered 2014-12-25: 25 mg via INTRAVENOUS
  Filled 2014-12-25: qty 1

## 2014-12-25 NOTE — ED Notes (Signed)
Pt states that he started having lt sided facial swelling x 1 day.  States that it started last week with the right side and it subsided.  Now lt side is swollen.  Denies any SOB or throat tightness.  States he is on BP meds but does not know the name

## 2014-12-25 NOTE — ED Provider Notes (Signed)
CSN: [id number]     Arrival date & time 12/25/14  1745 History   First MD Initiated Contact with Patient 12/25/14 2146     Chief Complaint  Patient presents with  . Facial Swelling   HPI Patient presents to the emergency room with complaints of facial swelling. Patient had an episode about a week ago which spontaneously resolved. At that time it was on the right side of his face. He applied an ice pack and it slowly resolved. Today he noticed swelling on the right side again but then it spread to the left side of his face. Now it seems to be on both sides. He denies any trouble with shortness of breath. He denies any throat tightness. He denies any difficulty swallowing. The patient does have an episode of angioedema in the past to lisinopril but denies taking that medication any longer. He is not aware of any new medications, or foods. Past Medical History  Diagnosis Date  . Hypertension     Mild LVH per echo  . Diabetes mellitus   . Kidney disease, chronic, stage III (GFR 30-59 ml/min)   . Periodontal disease   . CVA (cerebral infarction) 02/06/2011    Ischemic acute infarct in right anterior cerebral artery territory  . Confusion 02/06/2011    possibly secondary to cerebrovascular accident  versus dementia  . Angioedema 02/06/2011    lisinopril  . Cardiomyopathy 02/06/2011    Cardiomyopathy with systolic heart failure, newly diagnosed  . Alcohol abuse 02/06/2011    with alcohol withdrawal during hospital stay  . Periodontal disease     extensive  . Tobacco abuse   . Left bundle branch block 02/06/2011  . Marijuana abuse 02/06/2011  . Nonischemic cardiomyopathy     EF is 30 to 35%   Past Surgical History  Procedure Laterality Date  . US echocardiography  April 2012    EF 30 to 35 %   Family History  Problem Relation Age of Onset  . Coronary artery disease Other     Grandparents, unspecified  . Cancer Mother   . Cancer Father    History  Substance Use Topics  . Smoking  status: Former Smoker -- 0.50 packs/day    Types: Cigarettes    Quit date: 01/28/2011  . Smokeless tobacco: Not on file  . Alcohol Use: Yes     Comment: Moderate    Review of Systems  All other systems reviewed and are negative.     Allergies  Lisinopril  Home Medications   Prior to Admission medications   Medication Sig Start Date End Date Taking? Authorizing Provider  aspirin 81 MG tablet Take 81 mg by mouth daily.     Yes Historical Provider, MD  buPROPion (WELLBUTRIN SR) 100 MG 12 hr tablet Take 100 mg by mouth 2 (two) times daily.   Yes Historical Provider, MD  clopidogrel (PLAVIX) 75 MG tablet Take 75 mg by mouth daily with breakfast.   Yes Historical Provider, MD  diphenhydrAMINE (BENADRYL) 25 MG tablet Take 1 tablet (25 mg total) by mouth every 6 (six) hours. 12/25/14   Dorie Rank, MD  famotidine (PEPCID) 20 MG tablet Take 1 tablet (20 mg total) by mouth 2 (two) times daily. 12/25/14   Dorie Rank, MD  hydrALAZINE (APRESOLINE) 25 MG tablet Take 1 tablet (25 mg total) by mouth 2 (two) times daily. 03/09/11 11/06/13  Burtis Junes, NP  hydrochlorothiazide (HYDRODIURIL) 25 MG tablet Take 25 mg by mouth daily.   Yes  Historical Provider, MD  Insulin Glargine (LANTUS) 100 UNIT/ML Solostar Pen Inject 14 Units into the skin 2 (two) times daily. Inject 28 units daily 11/09/14  Yes Historical Provider, MD  isosorbide mononitrate (IMDUR) 30 MG 24 hr tablet Take 1 tablet (30 mg total) by mouth daily. 03/09/11 11/06/13  Burtis Junes, NP  isosorbide mononitrate (IMDUR) 30 MG 24 hr tablet TAKE 1 TABLET BY MOUTH DAILY 11/09/14  Yes Historical Provider, MD  multivitamin [facility]) per tablet Take 1 tablet by mouth daily.     Yes Historical Provider, MD  nitroGLYCERIN (NITROSTAT) 0.4 MG SL tablet Place 0.4 mg under the tongue every 5 (five) minutes as needed for chest pain.    Yes Historical Provider, MD  predniSONE (DELTASONE) 50 MG tablet Take 1 tablet (50 mg total) by mouth daily. 12/25/14   Dorie Rank, MD  rosuvastatin (CRESTOR) 10 MG tablet Take 10 mg by mouth daily.     Yes Historical Provider, MD  tamsulosin (FLOMAX) 0.4 MG CAPS capsule Take 0.4 mg by mouth daily after supper.  11/09/14 11/09/15 Yes Historical Provider, MD  traMADol (ULTRAM) 50 MG tablet Take 50 mg by mouth every 8 (eight) hours as needed for moderate pain.   Yes Historical Provider, MD   BP 124/90 mmHg  Pulse 91  Temp(Src) 98.2 F (36.8 C) (Oral)  Resp 20  SpO2 99% Physical Exam  Constitutional: He appears well-developed and well-nourished. No distress.  HENT:  Head: Normocephalic and atraumatic.  Right Ear: External ear normal.  Left Ear: External ear normal.  Mouth/Throat: No oral lesions. No uvula swelling. No oropharyngeal exudate.  Facial edema involving the cheeks and lips  Eyes: Conjunctivae are normal. Right eye exhibits no discharge. Left eye exhibits no discharge. No scleral icterus.  Neck: Neck supple. No tracheal deviation present.  Cardiovascular: Normal rate, regular rhythm and intact distal pulses.   Pulmonary/Chest: Effort normal and breath sounds normal. No stridor. No respiratory distress. He has no wheezes. He has no rales.  Abdominal: Soft. Bowel sounds are normal. He exhibits no distension. There is no tenderness. There is no rebound and no guarding.  Musculoskeletal: He exhibits no edema or tenderness.  Neurological: He is alert. He has normal strength. No cranial nerve deficit (no facial droop, extraocular movements intact, no slurred speech) or sensory deficit. He exhibits normal muscle tone. He displays no seizure activity. Coordination normal.  Skin: Skin is warm and dry. No rash noted.  Psychiatric: He has a normal mood and affect.  Nursing note and vitals reviewed.   ED Course  Procedures (including critical care time) Labs Review Labs Reviewed  CBG MONITORING, ED - Abnormal; Notable for the following:    Glucose-Capillary 116 (*)    All other components within normal limits   I-STAT CHEM 8, ED    Medications  diphenhydrAMINE (BENADRYL) injection 25 mg (25 mg Intravenous Given 12/25/14 2222)  methylPREDNISolone sodium succinate (SOLU-MEDROL) 125 mg/2 mL injection 125 mg (125 mg Intravenous Given 12/25/14 2222)  famotidine (PEPCID) IVPB 20 mg (0 mg Intravenous Stopped 12/25/14 2259)     MDM   Final diagnoses:  Angioedema, initial encounter    Pt has facial edema.  No airway compromise.  Previously had symptoms related to ace inhibitors.  Pt does not have his medications but the EMR does not suggest any ace inhibitors.  ?etiology.  Will monitor.  Anticipate dc with steroids and anthistamines    Dorie Rank, MD 12/25/14 2350

## 2014-12-25 NOTE — Discharge Instructions (Signed)
Angioedema Angioedema is sudden puffiness (swelling), often of the skin. It can happen:  On your face or privates (genitals).  In your belly (abdomen) or other body parts. It usually happens quickly and gets better in 1 or 2 days. It often starts at night and is found when you wake up. You may get red, itchy patches of skin (hives). Attacks can be dangerous if your breathing passages get puffy. The condition may happen only once, or it can come back at random times. It may happen for several years before it goes away for good. HOME CARE  Only take medicines as told by your doctor.  Always carry your emergency allergy medicines with you.  Wear a medical bracelet as told by your doctor.  Avoid things that you know will cause attacks (triggers). GET HELP IF:  You have another attack.  Your attacks happen more often or get worse.  The condition was passed to you by your parents and you want to have children. GET HELP RIGHT AWAY IF:   Your mouth, tongue, or lips are very puffy.  You have trouble breathing.  You have trouble swallowing.  You pass out (faint). MAKE SURE YOU:   Understand these instructions.  Will watch your condition.  Will get help right away if you are not doing well or get worse. Document Released: 10/03/2009 Document Revised: 08/05/2013 Document Reviewed: 06/08/2013 [facility] Patient Information 2015 [place], [place]. This information is not intended to replace advice given to you by your health care provider. Make sure you discuss any questions you have with your health care provider.

## 2014-12-26 NOTE — ED Notes (Signed)
Per RN chem 8 not needed.

## 2017-01-01 ENCOUNTER — Encounter: Payer: Self-pay | Admitting: *Deleted

## 2017-01-01 NOTE — Progress Notes (Signed)
Oncology Nurse Navigator Documentation  Oncology Nurse Navigator Flowsheets 01/01/2017  Navigator Location [facility]  Referral date to RadOnc/MedOnc 01/01/2017  Navigator Encounter Type Other/I received referral on Jim [name] today.  In reading the notes I am unclear if patient has received treatment.  I called [facility] and spoke with nurse.  She states she will get back with me for an update.   Barriers/Navigation Needs Coordination of Care  Interventions Coordination of Care  Coordination of Care Other  Acuity Level 3  Acuity Level 3 Other  Time Spent with Patient 45

## 2017-01-02 ENCOUNTER — Telehealth: Payer: Self-pay | Admitting: *Deleted

## 2017-01-02 DIAGNOSIS — R918 Other nonspecific abnormal finding of lung field: Secondary | ICD-10-CM | POA: Insufficient documentation

## 2017-01-02 NOTE — Telephone Encounter (Signed)
Oncology Nurse Navigator Documentation  Oncology Nurse Navigator Flowsheets 01/02/2017  Navigator Location [facility]  Navigator Encounter Type Telephone/I updated Dr. Julien Nordmann on referral I received yesterday on Mr. Skelton.  He asked to see the patient tomorrow.  I called Mr. Neis and updated.    Telephone Outgoing Call  Treatment Phase Pre-Tx/Tx Discussion  Barriers/Navigation Needs Coordination of Care  Interventions Coordination of Care  Coordination of Care Appts  Acuity Level 2  Acuity Level 2 Assistance expediting appointments  Time Spent with Patient 30

## 2017-01-03 ENCOUNTER — Other Ambulatory Visit (HOSPITAL_BASED_OUTPATIENT_CLINIC_OR_DEPARTMENT_OTHER): Payer: Medicare HMO

## 2017-01-03 ENCOUNTER — Ambulatory Visit: Payer: Medicare HMO | Attending: Internal Medicine | Admitting: Physical Therapy

## 2017-01-03 ENCOUNTER — Ambulatory Visit (HOSPITAL_BASED_OUTPATIENT_CLINIC_OR_DEPARTMENT_OTHER): Payer: Medicare HMO | Admitting: Internal Medicine

## 2017-01-03 ENCOUNTER — Encounter: Payer: Self-pay | Admitting: Internal Medicine

## 2017-01-03 DIAGNOSIS — Z7189 Other specified counseling: Secondary | ICD-10-CM

## 2017-01-03 DIAGNOSIS — C3411 Malignant neoplasm of upper lobe, right bronchus or lung: Secondary | ICD-10-CM | POA: Diagnosis not present

## 2017-01-03 DIAGNOSIS — C7951 Secondary malignant neoplasm of bone: Secondary | ICD-10-CM

## 2017-01-03 DIAGNOSIS — R2689 Other abnormalities of gait and mobility: Secondary | ICD-10-CM | POA: Insufficient documentation

## 2017-01-03 DIAGNOSIS — R293 Abnormal posture: Secondary | ICD-10-CM | POA: Diagnosis present

## 2017-01-03 DIAGNOSIS — M25552 Pain in left hip: Secondary | ICD-10-CM | POA: Diagnosis present

## 2017-01-03 DIAGNOSIS — R29898 Other symptoms and signs involving the musculoskeletal system: Secondary | ICD-10-CM | POA: Insufficient documentation

## 2017-01-03 DIAGNOSIS — R591 Generalized enlarged lymph nodes: Secondary | ICD-10-CM

## 2017-01-03 DIAGNOSIS — R918 Other nonspecific abnormal finding of lung field: Secondary | ICD-10-CM

## 2017-01-03 DIAGNOSIS — C7972 Secondary malignant neoplasm of left adrenal gland: Secondary | ICD-10-CM

## 2017-01-03 DIAGNOSIS — Z5111 Encounter for antineoplastic chemotherapy: Secondary | ICD-10-CM | POA: Insufficient documentation

## 2017-01-03 DIAGNOSIS — C7971 Secondary malignant neoplasm of right adrenal gland: Secondary | ICD-10-CM

## 2017-01-03 DIAGNOSIS — C3491 Malignant neoplasm of unspecified part of right bronchus or lung: Secondary | ICD-10-CM | POA: Insufficient documentation

## 2017-01-03 HISTORY — DX: Malignant neoplasm of unspecified part of right bronchus or lung: C34.91

## 2017-01-03 HISTORY — DX: Other specified counseling: Z71.89

## 2017-01-03 HISTORY — DX: Encounter for antineoplastic chemotherapy: Z51.11

## 2017-01-03 LAB — COMPREHENSIVE METABOLIC PANEL
ALBUMIN: 3.2 g/dL — AB (ref 3.5–5.0)
ALK PHOS: 84 U/L (ref 40–150)
ALT: 13 U/L (ref 0–55)
ANION GAP: 9 meq/L (ref 3–11)
AST: 23 U/L (ref 5–34)
BUN: 18.2 mg/dL (ref 7.0–26.0)
CALCIUM: 10 mg/dL (ref 8.4–10.4)
CO2: 26 mEq/L (ref 22–29)
CREATININE: 1 mg/dL (ref 0.7–1.3)
Chloride: 108 mEq/L (ref 98–109)
EGFR: 87 mL/min/{1.73_m2} — ABNORMAL LOW (ref >90)
Glucose: 132 mg/dl (ref 70–140)
Potassium: 4.4 mEq/L (ref 3.5–5.1)
Sodium: 143 mEq/L (ref 136–145)
Total Bilirubin: 0.46 mg/dL (ref 0.20–1.20)
Total Protein: 7.4 g/dL (ref 6.4–8.3)

## 2017-01-03 LAB — CBC WITH DIFFERENTIAL/PLATELET
BASO%: 1 % (ref 0.0–2.0)
BASOS ABS: 0.1 10*3/uL (ref 0.0–0.1)
EOS%: 2.7 % (ref 0.0–7.0)
Eosinophils Absolute: 0.2 10*3/uL (ref 0.0–0.5)
HEMATOCRIT: 31.1 % — AB (ref 38.4–49.9)
HEMOGLOBIN: 9.9 g/dL — AB (ref 13.0–17.1)
LYMPH%: 18 % (ref 14.0–49.0)
MCH: 28.8 pg (ref 27.2–33.4)
MCHC: 31.8 g/dL — AB (ref 32.0–36.0)
MCV: 90.4 fL (ref 79.3–98.0)
MONO#: 0.9 10*3/uL (ref 0.1–0.9)
MONO%: 9.4 % (ref 0.0–14.0)
NEUT#: 6.2 10*3/uL (ref 1.5–6.5)
NEUT%: 68.9 % (ref 39.0–75.0)
Platelets: 368 10*3/uL (ref 140–400)
RBC: 3.44 10*6/uL — ABNORMAL LOW (ref 4.20–5.82)
RDW: 14.5 % (ref 11.0–14.6)
WBC: 9 10*3/uL (ref 4.0–10.3)
lymph#: 1.6 10*3/uL (ref 0.9–3.3)

## 2017-01-03 MED ORDER — DEXAMETHASONE 4 MG PO TABS: ORAL_TABLET | ORAL | 1 refills | Status: DC

## 2017-01-03 MED ORDER — FOLIC ACID 1 MG PO TABS: 1.0000 mg | ORAL_TABLET | Freq: Every day | ORAL | 4 refills | Status: DC

## 2017-01-03 MED ORDER — CYANOCOBALAMIN 1000 MCG/ML IJ SOLN
1000.0000 ug | Freq: Once | INTRAMUSCULAR | Status: AC
  Administered 2017-01-03: 1000 ug via INTRAMUSCULAR

## 2017-01-03 MED ORDER — CYANOCOBALAMIN 1000 MCG/ML IJ SOLN
INTRAMUSCULAR | Status: AC
  Filled 2017-01-03: qty 1

## 2017-01-03 MED ORDER — PROCHLORPERAZINE MALEATE 10 MG PO TABS: 10.0000 mg | ORAL_TABLET | Freq: Four times a day (QID) | ORAL | 0 refills | Status: AC | PRN

## 2017-01-03 NOTE — Progress Notes (Signed)
[facility] [phone]   [phone]  CONSULT NOTE  REFERRING PHYSICIAN: Dr. Suzanna Obey  REASON FOR CONSULTATION:  71 years old African-American male recently diagnosed with lung cancer.  HPI TRAMANE GORUM is a 71 y.o. male with past medical history significant for diabetes mellitus, hypertension, peripheral vascular disease, stroke with left lower extremity weakness, dyslipidemia, osteoarthritis as well as history of smoking.The patient mentions that in December 2017 he was complaining of left shoulder pain for around 3 weeks as well as chronic cough with blood-tinged sputum. He also had swelling of his lower extremities. He was considered for chest x-ray and CT scan but the patient declined because of financial issues.he had CT scan of the chest performed on 10/25/2016 and it showed no acute pulmonary embolism but there was finding a large focus of masslike area consolidation extending from the right pulmonary hila up to the posterior right upper lobe and right lung apex with measurement of [phone] cm. This is associated with multiple small bilateral irregular pulmonary nodules and the index pulmonary nodule in the right upper lobe measured up to 1.4 see him. The left lower lobe pulmonary nodule measured 0.9 cm. There was also moderate mediastinal and right hilar lymphadenopathy and a small right pleural effusion worrisome for underlying metastatic bronchogenic carcinoma. There was also an irregular nodularity in the bilateral adrenal glands worrisome for adrenal metastasis.a PET scan was performed on 11/02/2016 and it showed hypermetabolic right upper lobe lung mass with bilateral lung nodules showed increased activity. There was hypermetabolic thoracic and left neck adenopathy. There is hypermetabolic foci in the cervical spine as well as lumbar spine likely representing bone metastasis. There was also a focus of mild increased activity in the left  adrenal gland. 12/04/2016 the patient underwent ultrasound-guided core biopsy of a left cervical lymph node by interventional radiology at [facility]. The final pathology was consistent with non-small cell lung cancer, moderately to poorly differentiated adenocarcinoma.the immunohistochemistry stains showed the tumor cells were strongly positive for cytokeratin 7, TTF-1 and Napsin but negative for cytokeratin 20 and CDX-2. Molecular studies were performed and it was negative for EGFR, ALK,ROS1 and BRAF mutations. PDL 1 was also negative. The patient lives in [place] and he was referred to me today for evaluation and recommendation regarding treatment of his condition. When seen today he is feeling fine except for the weakness in the lower extremities more on the left side secondary to his previous stroke. He has chest congestion in the morning as well as cough productive of blood-tinged sputum but no frank hemoptysis. He denied having any chest pain. He denied having any nausea, vomiting, diarrhea or constipation. He has no fever or chills. He denied having any headache or visual changes. He has a fall at home few days ago. Family history significant for mother with breast cancer. He doesn't know much about his father. The patient is single and he lives in [place] with the sister close by. He has 4 children including 1 daughter in [place], 2 daughters in [place] and one son and [place]. Used to work as a Information systems manager. He has a history of smoking for around 10 years. Also drinks at regular basis. No history of drug abuse.  HPI  Past Medical History:  Diagnosis Date  . Adenocarcinoma of right lung, stage 4 ([place]) 01/03/2017  . Alcohol abuse 02/06/2011   with alcohol withdrawal during hospital stay  . Angioedema 02/06/2011   lisinopril  . Cardiomyopathy  02/06/2011   Cardiomyopathy with systolic heart failure, newly diagnosed  . Confusion 02/06/2011   possibly secondary to  cerebrovascular accident  versus dementia  . CVA (cerebral infarction) 02/06/2011   Ischemic acute infarct in right anterior cerebral artery territory  . Diabetes mellitus   . Encounter for antineoplastic chemotherapy 01/03/2017  . Goals of care, counseling/discussion 01/03/2017  . Hypertension    Mild LVH per echo  . Kidney disease, chronic, stage III (GFR 30-59 ml/min)   . Left bundle branch block 02/06/2011  . Marijuana abuse 02/06/2011  . Nonischemic cardiomyopathy ([facility])    EF is 30 to 35%  . Periodontal disease   . Periodontal disease    extensive  . Tobacco abuse     Past Surgical History:  Procedure Laterality Date  . US ECHOCARDIOGRAPHY  April 2012   EF 30 to 35 %    Family History  Problem Relation Age of Onset  . Cancer Mother   . Cancer Father   . Coronary artery disease Other     Grandparents, unspecified    Social History Social History  Substance Use Topics  . Smoking status: Former Smoker    Packs/day: 0.50    Types: Cigarettes    Quit date: 01/28/2011  . Smokeless tobacco: Not on file  . Alcohol use Yes     Comment: Moderate    Allergies  Allergen Reactions  . Metformin And Related Other (See Comments)    Blood sugar went up  . Lisinopril     Angioedema    Current Outpatient Prescriptions  Medication Sig Dispense Refill  . albuterol (PROVENTIL HFA;VENTOLIN HFA) 108 (90 Base) MCG/ACT inhaler Inhale 1 puff into the lungs every 6 (six) hours as needed.    Marland Kitchen aspirin 81 MG tablet Take 81 mg by mouth daily.      Marland Kitchen buPROPion (WELLBUTRIN SR) 100 MG 12 hr tablet Take 100 mg by mouth 2 (two) times daily.    . carvedilol (COREG) 12.5 MG tablet Take 1 tablet by mouth daily.    . clopidogrel (PLAVIX) 75 MG tablet Take 75 mg by mouth daily with breakfast.    . famotidine (PEPCID) 20 MG tablet Take 1 tablet (20 mg total) by mouth 2 (two) times daily. 10 tablet 0  . Insulin Glargine (LANTUS) 100 UNIT/ML Solostar Pen Inject 14 Units into the skin 2 (two) times  daily. Inject 28 units daily    . multivitamin (THERAGRAN) per tablet Take 1 tablet by mouth daily.      . tamsulosin (FLOMAX) 0.4 MG CAPS capsule     . traMADol (ULTRAM) 50 MG tablet Take 50 mg by mouth every 8 (eight) hours as needed for moderate pain.    . traZODone (DESYREL) 50 MG tablet Take 50 mg by mouth at bedtime. Take 1/2 to 1 tablet hs    . dexamethasone (DECADRON) 4 MG tablet 4 mg by mouth twice a day the day before, day of and day after the chemotherapy every 3 weeks 40 tablet 1  . diphenhydrAMINE (BENADRYL) 25 MG tablet Take 1 tablet (25 mg total) by mouth every 6 (six) hours. (Patient not taking: Reported on 01/03/2017) 20 tablet 0  . folic acid (FOLVITE) 1 MG tablet Take 1 tablet (1 mg total) by mouth daily. 30 tablet 4  . hydrALAZINE (APRESOLINE) 25 MG tablet Take 1 tablet (25 mg total) by mouth 2 (two) times daily. 120 tablet 11  . hydrochlorothiazide (HYDRODIURIL) 25 MG tablet Take 25 mg by mouth  daily.    . isosorbide mononitrate (IMDUR) 30 MG 24 hr tablet Take 1 tablet (30 mg total) by mouth daily. 30 tablet 11  . isosorbide mononitrate (IMDUR) 30 MG 24 hr tablet TAKE 1 TABLET BY MOUTH DAILY    . nitroGLYCERIN (NITROSTAT) 0.4 MG SL tablet Place 0.4 mg under the tongue every 5 (five) minutes as needed for chest pain.     . predniSONE (DELTASONE) 50 MG tablet Take 1 tablet (50 mg total) by mouth daily. 5 tablet 0  . prochlorperazine (COMPAZINE) 10 MG tablet Take 1 tablet (10 mg total) by mouth every 6 (six) hours as needed for nausea or vomiting. 30 tablet 0  . rosuvastatin (CRESTOR) 10 MG tablet Take 10 mg by mouth daily.       Current Facility-Administered Medications  Medication Dose Route Frequency Provider Last Rate Last Dose  . cyanocobalamin ((VITAMIN B-12)) injection 1,000 mcg  1,000 mcg Intramuscular Once Curt Bears, MD        Review of Systems  Constitutional: positive for fatigue Eyes: negative Ears, nose, mouth, throat, and face: negative Respiratory:  positive for cough, dyspnea on exertion, hemoptysis and sputum Cardiovascular: negative Gastrointestinal: negative Genitourinary:negative Integument/breast: negative Hematologic/lymphatic: negative Musculoskeletal:positive for muscle weakness Neurological: negative Behavioral/Psych: negative Endocrine: negative Allergic/Immunologic: negative  Physical Exam  UVO:ZDGUY, healthy, no distress, well nourished and well developed SKIN: skin color, texture, turgor are normal, no rashes or significant lesions HEAD: Normocephalic, No masses, lesions, tenderness or abnormalities EYES: normal, PERRLA, Conjunctiva are pink and non-injected EARS: External ears normal, Canals clear OROPHARYNX:no exudate, no erythema and lips, buccal mucosa, and tongue normal  NECK: supple, no adenopathy, no JVD LYMPH:  no palpable lymphadenopathy, no hepatosplenomegaly LUNGS: expiratory wheezes bilaterally HEART: regular rate & rhythm, no murmurs and no gallops ABDOMEN:abdomen soft, non-tender, normal bowel sounds and no masses or organomegaly BACK: Back symmetric, no curvature., No CVA tenderness EXTREMITIES:no joint deformities, effusion, or inflammation, no edema, no skin discoloration  NEURO: alert & oriented x 3 with fluent speech, no focal motor/sensory deficits  PERFORMANCE STATUS: ECOG 1  LABORATORY DATA: Lab Results  Component Value Date   WBC 9.0 01/03/2017   HGB 9.9 (L) 01/03/2017   HCT 31.1 (L) 01/03/2017   MCV 90.4 01/03/2017   PLT 368 01/03/2017      Chemistry      Component Value Date/Time   NA 143 01/03/2017 1602   K 4.4 01/03/2017 1602   CL 99 11/06/2013 1940   CO2 26 01/03/2017 1602   BUN 18.2 01/03/2017 1602   CREATININE 1.0 01/03/2017 1602      Component Value Date/Time   CALCIUM 10.0 01/03/2017 1602   ALKPHOS 84 01/03/2017 1602   AST 23 01/03/2017 1602   ALT 13 01/03/2017 1602   BILITOT 0.46 01/03/2017 1602       RADIOGRAPHIC STUDIES: No results  found.  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  PLAN:i had a lengthy discussion with the patient today about his current disease stage, prognosis and treatment options. His pain is his scan were performed more than 2 months ago.I recommended for the patient to have repeat CT scan  of the chest, abdomen and pelvis for restaging of his disease before starting any systemic therapy. I will also complete the staging workup by ordering a MRI of the brain to rule out brain metastasis. I explained to the patient that he has incurable condition and of the treatment will be of palliative nature. I discussed with him the goals of care. I gave the patient the option of palliative care and hospice referral versus consideration of systemic chemotherapy with carboplatin and Alimta. I would not consider the patient for treatment with Avastin at this point because of his hemoptysis. The patient is interested in proceeding with treatment. I discussed with the patient adverse effects of this treatment including but not limited to alopecia, myelosuppression, nausea and vomiting, peripheral neuropathy, liver or renal dysfunction. We will arrange for the patient to receive vitamin B 12 injection today. The patient would also receive prescription for Compazine 10 mg by mouth every 6 hours as needed for nausea, Decadron 4 mg by mouth twice a day, the day before, day of and day after the chemotherapy in addition to folic acid 1 mg by mouth daily. I will arrange for the patient to have a chemotherapy education class before starting the first dose of the  chemotherapy. I will also arrange for the patient to have a Port-A-Cath placed for chemotherapy infusion. He is expected to start the first dose of his treatment next week. He would come back for follow-up visit in 2 weeks for evaluation and management of any adverse effect of his treatment. The patient was seen during the multidisciplinary thoracic oncology clinic today by medical oncology and physical therapist. He was advised to call immediately if he has any concerning symptoms in the interval. The patient voices understanding of current disease status and treatment options and is in agreement with the current care plan.  All questions were answered. The patient knows to call the clinic with any problems, questions or concerns. We can certainly see the patient much sooner if necessary.  Thank you so much for allowing me to participate in the care of [employer]. I will continue to follow up the patient with you and assist in his care.  I spent 55 minutes counseling the patient face to face. The total time spent in the appointment was 80 minutes.  Disclaimer: This note was dictated with voice recognition software. Similar sounding words can inadvertently be transcribed and may not be corrected upon review.   Elihu Milstein K. January 03, 2017, 5:16 PM

## 2017-01-03 NOTE — Progress Notes (Signed)
START ON PATHWAY REGIMEN - Non-Small Cell Lung     A cycle is every 21 days:     Pemetrexed      Carboplatin   **Always confirm dose/schedule in your pharmacy ordering system**    Patient Characteristics: Stage IV Metastatic, Non Squamous, Initial Chemotherapy/Immunotherapy, PS = 0, 1, PD-L1 Expression Positive 1-49% (TPS) / Negative / Not Tested / Not a Candidate for Immunotherapy AJCC T Category: T3 Current Disease Status: Distant Metastases AJCC N Category: N3 AJCC M Category: M1c AJCC 8 Stage Grouping: IVB Histology: Non Squamous Cell ROS1 Rearrangement Status: Quantity Not Sufficient T790M Mutation Status: Not Applicable - EGFR Mutation Negative/Unknown Other Mutations/Biomarkers: No Other Actionable Mutations PD-L1 Expression Status: PD-L1 Negative Chemotherapy/Immunotherapy LOT: Initial Chemotherapy/Immunotherapy Molecular Targeted Therapy: Not Appropriate ALK Translocation Status: Negative Would you be surprised if this patient died  in the next year? I would NOT be surprised if this patient died in the next year EGFR Mutation Status: Negative/Wild Type BRAF V600E Mutation Status: Quantity Not Sufficient Performance Status: PS = 0, 1  Intent of Therapy: Non-Curative / Palliative Intent, Discussed with Patient

## 2017-01-03 NOTE — Therapy (Signed)
[place], [place], [address] Phone: [phone]   Fax:  [phone]  Physical Therapy Evaluation  Patient Details  Name: Jim [name] MRN: [id number] Date of Birth: 1945/12/02 Referring Provider: Dr. Curt Bears  Encounter Date: 01/03/2017      PT End of Session - 01/03/17 2000    Visit Number 1   Number of Visits 1   PT Start Time 8115   PT Stop Time 1728   PT Time Calculation (min) 23 min   Activity Tolerance Patient tolerated treatment well   Behavior During Therapy [facility] for tasks assessed/performed      Past Medical History:  Diagnosis Date  . Adenocarcinoma of right lung, stage 4 ([place]) 01/03/2017  . Alcohol abuse 02/06/2011   with alcohol withdrawal during hospital stay  . Angioedema 02/06/2011   lisinopril  . Cardiomyopathy 02/06/2011   Cardiomyopathy with systolic heart failure, newly diagnosed  . Confusion 02/06/2011   possibly secondary to cerebrovascular accident  versus dementia  . CVA (cerebral infarction) 02/06/2011   Ischemic acute infarct in right anterior cerebral artery territory  . Diabetes mellitus   . Encounter for antineoplastic chemotherapy 01/03/2017  . Goals of care, counseling/discussion 01/03/2017  . Hypertension    Mild LVH per echo  . Kidney disease, chronic, stage III (GFR 30-59 ml/min)   . Left bundle branch block 02/06/2011  . Marijuana abuse 02/06/2011  . Nonischemic cardiomyopathy (HCC)    EF is 30 to 35%  . Periodontal disease   . Periodontal disease    extensive  . Tobacco abuse     Past Surgical History:  Procedure Laterality Date  . US ECHOCARDIOGRAPHY  April 2012   EF 30 to 35 %    There were no vitals filed for this visit.       Subjective Assessment - 01/03/17 1922    Subjective Reports some left hip pain since a fall.   Pertinent History Pt. presented with a cough with hemoptysis in mid-December 2017.  There was a delay in his getting  referred to [facility].  His diagnosis is malignant neoplasm of lung, mediastinal and hilar adenopathy, so stage IV.  He is expected to undergo chemotherapy or other systemic treatment.  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   Patient Stated Goals get info from all lung clinic providers   Currently in Pain? Yes   Pain Score 6   6-7   Pain Location Hip   Pain Orientation Left   Pain Descriptors / Indicators Aching   Aggravating Factors  sitting on the left hip   Pain Relieving Factors not sitting on the left hip            [facility] PT Assessment - 01/03/17 0001      Assessment   Medical Diagnosis stage IV lung cancer   Referring Provider Dr. Curt Bears   Onset Date/Surgical Date 10/12/16  approximately   Prior Therapy none     Precautions   Precautions Fall;Other (comment)   Precaution Comments cancer precautions     Restrictions   Weight Bearing Restrictions No     Balance Screen   Has the patient fallen in the past 6 months Yes  had new house shoes that stuck to flooring   How many times? 2   Has the patient had a decrease in activity level because of a fear of falling?  No  Is the patient reluctant to leave their home because of a fear of falling?  No     Home Environment   Living Environment Private residence   Living Arrangements Alone   Type of [place] to enter   Entrance Stairs-Number of Steps 5   Entrance Stairs-Rails --  yes   Home Layout One level     Prior Function   Level of Independence Independent with basic ADLs;Independent with household mobility with device   Vocation Requirements     Leisure uses a machine that includes arm and leg  movements, for about 20 minutes 3x/week     Cognition   Overall Cognitive Status Within Functional Limits for tasks assessed     Observation/Other Assessments   Observations older gentleman slouching in a  [place] wheelchair     Functional Tests   Functional tests Sit to Stand     Sit to Stand   Comments 2x in 30 seconds, well below average for age; he reported right knee discomfort that limited him for this  2 reps performed slowly     Posture/Postural Control   Posture/Postural Control Postural limitations   Postural Limitations Flexed trunk  slight     ROM / Strength   AROM / PROM / Strength AROM;Strength     AROM   Overall AROM Comments For standing trunk AROM: flexion--reaches fingertips about 8 inches to floor; extension to neutral only; sidebend WFL bilat.; rotation right 20% limited, left 50% limited.     Strength   Overall Strength Comments left side weakness s/p CVA, not formally assessed     Ambulation/Gait   Ambulation/Gait Yes   Ambulation/Gait Assistance 5: Supervision   Ambulation/Gait Assistance Details 8   Assistive device Straight cane  and gets support on furniture when he can   Gait Pattern --   Gait Comments left leg is externally rotated and there is decreased left LE flexion on swing phase; pt. reports he has a wheelchair and walker at home  left knee hyperextension also noted     Balance   Balance Assessed Yes     Dynamic Standing Balance   Dynamic Standing - Comments reaches forward about 5 inches in standing, below average for age     Functional Gait  Assessment   Gait assessed  --                           PT Education - 01/03/17 1959    Education provided Yes   Education Details energy conservation, walking or other exercise, Cure article on staying active, posture, breathing, PT info   Person(s) Educated Patient   Methods Explanation;Other (comment)   Comprehension Verbalized understanding               Lung Clinic Goals - 01/03/17 2012      Patient will be able to verbalize understanding of the benefit of exercise to decrease fatigue.   Status Achieved     Patient will be able to verbalize the  importance of posture.   Status Achieved     Patient will be able to demonstrate diaphragmatic breathing for improved lung function.   Status Achieved     Patient will be able to verbalize understanding of the role of physical therapy to prevent functional decline and who to contact if physical therapy is needed.   Status Achieved  Plan - 01-27-17 01-31-02    Clinical Impression Statement This is a pleasant gentleman with diagnosis of stage IV lung cancer, expected to receive some type of systemic therapy.  He has significantly impaired mobility and has fallen; he has left hip pain as a result.  Eval is moderate complexity with multiple comorbidities including h/o CVA with left hemiparesis, DM and HTN; status is evolving as his lung cancer is still being assessed and he will begin treatment soon.   Rehab Potential Fair   Clinical Impairments Affecting Rehab Potential h/o CVA with left hemiparesis   PT Frequency One time visit   PT Treatment/Interventions Patient/family education   PT Next Visit Plan None at this time, though patient may benefit from therapy for strengthening, gait and endurance training, and functional mobility training during or after lung cancer treament.   PT Home Exercise Plan using home exercise equipment for endurance training, breathing exercise   Consulted and Agree with Plan of Care Patient      Patient will benefit from skilled therapeutic intervention in order to improve the following deficits and impairments:  Abnormal gait, Decreased activity tolerance, Decreased balance, Decreased range of motion, Decreased strength, Pain  Visit Diagnosis: Other abnormalities of gait and mobility - Plan: PT plan of care cert/re-cert  Pain in left hip - Plan: PT plan of care cert/re-cert  Other symptoms and signs involving the musculoskeletal system - Plan: PT plan of care cert/re-cert  Abnormal posture - Plan: PT plan of care cert/re-cert      G-Codes -  Jan 27, 2017 2012/02/01    Functional Assessment Tool Used (Outpatient Only) clinical judgement   Functional Limitation Mobility: Walking and moving around   Mobility: Walking and Moving Around Current Status [phone]) At least 40 percent but less than 60 percent impaired, limited or restricted   Mobility: Walking and Moving Around Goal Status [phone]) At least 40 percent but less than 60 percent impaired, limited or restricted   Mobility: Walking and Moving Around Discharge Status [phone]) At least 40 percent but less than 60 percent impaired, limited or restricted       Problem List Patient Active Problem List   Diagnosis Date Noted  . Adenocarcinoma of right lung, stage 4 ([place]) 27-Jan-2017  . Goals of care, counseling/discussion 2017/01/27  . Encounter for antineoplastic chemotherapy 01/27/17  . Lung mass 01/02/2017  . Multiple substance abuse 03/09/2011  . Nonischemic cardiomyopathy ([place])   . Kidney disease, chronic, stage III (GFR 30-59 ml/min)   . Hypertension   . CVA (cerebral infarction) 02/06/2011    SALISBURY,DONNA 2017/01/27, 8:16 PM  [place], [place], [address] Phone: [phone]   Fax:  [phone]  Name: ARLYN BUERKLE MRN: [id number] Date of Birth: 10/23/1946  Serafina Royals, PT 2017/01/27 8:16 PM

## 2017-01-11 ENCOUNTER — Ambulatory Visit (HOSPITAL_COMMUNITY)
Admission: RE | Admit: 2017-01-11 | Discharge: 2017-01-11 | Disposition: A | Discharge status: Home / Self Care | Payer: Medicare HMO | Source: Ambulatory Visit | Attending: Internal Medicine | Admitting: Internal Medicine

## 2017-01-11 DIAGNOSIS — C3491 Malignant neoplasm of unspecified part of right bronchus or lung: Secondary | ICD-10-CM | POA: Diagnosis not present

## 2017-01-11 DIAGNOSIS — I63529 Cerebral infarction due to unspecified occlusion or stenosis of unspecified anterior cerebral artery: Secondary | ICD-10-CM | POA: Diagnosis not present

## 2017-01-11 DIAGNOSIS — Z5111 Encounter for antineoplastic chemotherapy: Secondary | ICD-10-CM | POA: Diagnosis not present

## 2017-01-11 DIAGNOSIS — Z7189 Other specified counseling: Secondary | ICD-10-CM | POA: Insufficient documentation

## 2017-01-11 MED ORDER — GADOBENATE DIMEGLUMINE 529 MG/ML IV SOLN
20.0000 mL | Freq: Once | INTRAVENOUS | Status: AC | PRN
  Administered 2017-01-11: 18 mL via INTRAVENOUS

## 2017-01-14 ENCOUNTER — Telehealth: Payer: Self-pay | Admitting: Internal Medicine

## 2017-01-14 NOTE — Telephone Encounter (Signed)
Spoke with patient re next appointments for 3/20 and 3/21. Per patient he cannot come 3/20 due to no transportation. Ched class for 3/20 moved to 3/21 @ 10 am prior to 11:30 am lab/chemo. Patient aware and has appointments date/time. Patient will get new schedule at 3/21 visit.

## 2017-01-15 ENCOUNTER — Other Ambulatory Visit: Payer: Medicare HMO

## 2017-01-16 ENCOUNTER — Encounter: Payer: Self-pay | Admitting: *Deleted

## 2017-01-16 ENCOUNTER — Ambulatory Visit (HOSPITAL_BASED_OUTPATIENT_CLINIC_OR_DEPARTMENT_OTHER): Payer: Medicare HMO

## 2017-01-16 ENCOUNTER — Other Ambulatory Visit (HOSPITAL_BASED_OUTPATIENT_CLINIC_OR_DEPARTMENT_OTHER): Payer: Medicare HMO

## 2017-01-16 ENCOUNTER — Other Ambulatory Visit: Payer: Medicare HMO

## 2017-01-16 VITALS — BP 134/93 | HR 87 | Temp 98.3°F | Resp 20

## 2017-01-16 DIAGNOSIS — Z5111 Encounter for antineoplastic chemotherapy: Secondary | ICD-10-CM | POA: Diagnosis not present

## 2017-01-16 DIAGNOSIS — C3411 Malignant neoplasm of upper lobe, right bronchus or lung: Secondary | ICD-10-CM

## 2017-01-16 DIAGNOSIS — C7951 Secondary malignant neoplasm of bone: Secondary | ICD-10-CM | POA: Diagnosis not present

## 2017-01-16 DIAGNOSIS — C3491 Malignant neoplasm of unspecified part of right bronchus or lung: Secondary | ICD-10-CM

## 2017-01-16 DIAGNOSIS — C7972 Secondary malignant neoplasm of left adrenal gland: Secondary | ICD-10-CM

## 2017-01-16 DIAGNOSIS — C7971 Secondary malignant neoplasm of right adrenal gland: Secondary | ICD-10-CM | POA: Diagnosis not present

## 2017-01-16 LAB — CBC WITH DIFFERENTIAL/PLATELET
BASO%: 1.1 % (ref 0.0–2.0)
Basophils Absolute: 0.1 10*3/uL (ref 0.0–0.1)
EOS%: 2.1 % (ref 0.0–7.0)
Eosinophils Absolute: 0.2 10*3/uL (ref 0.0–0.5)
HCT: 31.1 % — ABNORMAL LOW (ref 38.4–49.9)
HGB: 10.3 g/dL — ABNORMAL LOW (ref 13.0–17.1)
LYMPH%: 15.3 % (ref 14.0–49.0)
MCH: 29.4 pg (ref 27.2–33.4)
MCHC: 33 g/dL (ref 32.0–36.0)
MCV: 89.1 fL (ref 79.3–98.0)
MONO#: 0.8 10*3/uL (ref 0.1–0.9)
MONO%: 9.7 % (ref 0.0–14.0)
NEUT#: 5.6 10*3/uL (ref 1.5–6.5)
NEUT%: 71.8 % (ref 39.0–75.0)
Platelets: 328 10*3/uL (ref 140–400)
RBC: 3.49 10*6/uL — ABNORMAL LOW (ref 4.20–5.82)
RDW: 16.5 % — ABNORMAL HIGH (ref 11.0–14.6)
WBC: 7.8 10*3/uL (ref 4.0–10.3)
lymph#: 1.2 10*3/uL (ref 0.9–3.3)

## 2017-01-16 LAB — COMPREHENSIVE METABOLIC PANEL
ALK PHOS: 81 U/L (ref 40–150)
ALT: 12 U/L (ref 0–55)
ANION GAP: 10 meq/L (ref 3–11)
AST: 22 U/L (ref 5–34)
Albumin: 3.4 g/dL — ABNORMAL LOW (ref 3.5–5.0)
BILIRUBIN TOTAL: 0.49 mg/dL (ref 0.20–1.20)
BUN: 20.1 mg/dL (ref 7.0–26.0)
CO2: 26 meq/L (ref 22–29)
Calcium: 10.2 mg/dL (ref 8.4–10.4)
Chloride: 105 mEq/L (ref 98–109)
Creatinine: 1.1 mg/dL (ref 0.7–1.3)
EGFR: 78 mL/min/{1.73_m2} — AB (ref >90)
Glucose: 124 mg/dl (ref 70–140)
POTASSIUM: 3.7 meq/L (ref 3.5–5.1)
Sodium: 141 mEq/L (ref 136–145)
Total Protein: 7.4 g/dL (ref 6.4–8.3)

## 2017-01-16 MED ORDER — PALONOSETRON HCL INJECTION 0.25 MG/5ML
0.2500 mg | Freq: Once | INTRAVENOUS | Status: AC
  Administered 2017-01-16: 0.25 mg via INTRAVENOUS

## 2017-01-16 MED ORDER — PALONOSETRON HCL INJECTION 0.25 MG/5ML
INTRAVENOUS | Status: AC
  Filled 2017-01-16: qty 5

## 2017-01-16 MED ORDER — DEXAMETHASONE SODIUM PHOSPHATE 10 MG/ML IJ SOLN
10.0000 mg | Freq: Once | INTRAMUSCULAR | Status: AC
Start: 2017-01-16 — End: 2017-01-16
  Administered 2017-01-16: 10 mg via INTRAVENOUS

## 2017-01-16 MED ORDER — DEXAMETHASONE SODIUM PHOSPHATE 10 MG/ML IJ SOLN
INTRAMUSCULAR | Status: AC
  Filled 2017-01-16: qty 1

## 2017-01-16 MED ORDER — SODIUM CHLORIDE 0.9 % IV SOLN
Freq: Once | INTRAVENOUS | Status: AC
  Administered 2017-01-16: 13:00:00 via INTRAVENOUS

## 2017-01-16 MED ORDER — SODIUM CHLORIDE 0.9 % IV SOLN
480.0000 mg/m2 | Freq: Once | INTRAVENOUS | Status: AC
  Administered 2017-01-16: 1000 mg via INTRAVENOUS
  Filled 2017-01-16: qty 40

## 2017-01-16 MED ORDER — CARBOPLATIN CHEMO INJECTION 600 MG/60ML
544.5000 mg | Freq: Once | INTRAVENOUS | Status: AC
  Administered 2017-01-16: 540 mg via INTRAVENOUS
  Filled 2017-01-16: qty 54

## 2017-01-16 NOTE — Patient Instructions (Signed)
[facility] Discharge Instructions for Patients Receiving Chemotherapy  Today you received the following chemotherapy agents Alimta/Carboplatin  To help prevent nausea and vomiting after your treatment, we encourage you to take your nausea medication as prescribed.    If you develop nausea and vomiting that is not controlled by your nausea medication, call the clinic.   BELOW ARE SYMPTOMS THAT SHOULD BE REPORTED IMMEDIATELY:  *FEVER GREATER THAN 100.5 F  *CHILLS WITH OR WITHOUT FEVER  NAUSEA AND VOMITING THAT IS NOT CONTROLLED WITH YOUR NAUSEA MEDICATION  *UNUSUAL SHORTNESS OF BREATH  *UNUSUAL BRUISING OR BLEEDING  TENDERNESS IN MOUTH AND THROAT WITH OR WITHOUT PRESENCE OF ULCERS  *URINARY PROBLEMS  *BOWEL PROBLEMS  UNUSUAL RASH Items with * indicate a potential emergency and should be followed up as soon as possible.  Feel free to call the clinic you have any questions or concerns. The clinic phone number is [phone].  Please show the CHEMO ALERT CARD at check-in to the Emergency Department and triage nurse.   Pemetrexed injection (Alimta) What is this medicine? PEMETREXED (PEM e TREX ed) is a chemotherapy drug used to treat lung cancers like non-small cell lung cancer and mesothelioma. It may also be used to treat other cancers. This medicine may be used for other purposes; ask your health care provider or pharmacist if you have questions. COMMON BRAND NAME(S): Alimta What should I tell my health care provider before I take this medicine? They need to know if you have any of these conditions: -infection (especially a virus infection such as chickenpox, cold sores, or herpes) -kidney disease -low blood counts, like low white cell, platelet, or red cell counts -lung or breathing disease, like asthma -radiation therapy -an unusual or allergic reaction to pemetrexed, other medicines, foods, dyes, or preservative -pregnant or trying to get  pregnant -breast-feeding How should I use this medicine? This drug is given as an infusion into a vein. It is administered in a hospital or clinic by a specially trained health care professional. Talk to your pediatrician regarding the use of this medicine in children. Special care may be needed. Overdosage: If you think you have taken too much of this medicine contact a poison control center or emergency room at once. NOTE: This medicine is only for you. Do not share this medicine with others. What if I miss a dose? It is important not to miss your dose. Call your doctor or health care professional if you are unable to keep an appointment. What may interact with this medicine? This medicine may interact with the following medications: -Ibuprofen This list may not describe all possible interactions. Give your health care provider a list of all the medicines, herbs, non-prescription drugs, or dietary supplements you use. Also tell them if you smoke, drink alcohol, or use illegal drugs. Some items may interact with your medicine. What should I watch for while using this medicine? Visit your doctor for checks on your progress. This drug may make you feel generally unwell. This is not uncommon, as chemotherapy can affect healthy cells as well as cancer cells. Report any side effects. Continue your course of treatment even though you feel ill unless your doctor tells you to stop. In some cases, you may be given additional medicines to help with side effects. Follow all directions for their use. Call your doctor or health care professional for advice if you get a fever, chills or sore throat, or other symptoms of a cold or flu. Do not   treat yourself. This drug decreases your body's ability to fight infections. Try to avoid being around people who are sick. This medicine may increase your risk to bruise or bleed. Call your doctor or health care professional if you notice any unusual bleeding. Be careful  brushing and flossing your teeth or using a toothpick because you may get an infection or bleed more easily. If you have any dental work done, tell your dentist you are receiving this medicine. Avoid taking products that contain aspirin, acetaminophen, ibuprofen, naproxen, or ketoprofen unless instructed by your doctor. These medicines may hide a fever. Call your doctor or health care professional if you get diarrhea or mouth sores. Do not treat yourself. To protect your kidneys, drink water or other fluids as directed while you are taking this medicine. Do not become pregnant while taking this medicine or for 6 months after stopping it. Women should inform their doctor if they wish to become pregnant or think they might be pregnant. Men should not father a child while taking this medicine and for 3 months after stopping it. This may interfere with the ability to father a child. You should talk to your doctor or health care professional if you are concerned about your fertility. There is a potential for serious side effects to an unborn child. Talk to your health care professional or pharmacist for more information. Do not breast-feed an infant while taking this medicine or for 1 week after stopping it. What side effects may I notice from receiving this medicine? Side effects that you should report to your doctor or health care professional as soon as possible: -allergic reactions like skin rash, itching or hives, swelling of the face, lips, or tongue -breathing problems -redness, blistering, peeling or loosening of the skin, including inside the mouth -signs and symptoms of bleeding such as bloody or black, tarry stools; red or dark-brown urine; spitting up blood or brown material that looks like coffee grounds; red spots on the skin; unusual bruising or bleeding from the eye, gums, or nose -signs and symptoms of infection like fever or chills; cough; sore throat; pain or trouble passing urine -signs  and symptoms of kidney injury like trouble passing urine or change in the amount of urine -signs and symptoms of liver injury like dark yellow or brown urine; general ill feeling or flu-like symptoms; light-colored stools; loss of appetite; nausea; right upper belly pain; unusually weak or tired; yellowing of the eyes or skin Side effects that usually do not require medical attention (report to your doctor or health care professional if they continue or are bothersome): -constipation -dizziness -mouth sores -nausea, vomiting -pain, tingling, numbness in the hands or feet -unusually weak or tired This list may not describe all possible side effects. Call your doctor for medical advice about side effects. You may report side effects to FDA at 1-800-FDA-1088. Where should I keep my medicine? This drug is given in a hospital or clinic and will not be stored at home. NOTE: This sheet is a summary. It may not cover all possible information. If you have questions about this medicine, talk to your doctor, pharmacist, or health care provider.  2018 Elsevier/Gold Standard (2016-08-14 18:51:46)   Carboplatin injection What is this medicine? CARBOPLATIN (KAR boe pla tin) is a chemotherapy drug. It targets fast dividing cells, like cancer cells, and causes these cells to die. This medicine is used to treat ovarian cancer and many other cancers. This medicine may be used for other purposes; ask   your health care provider or pharmacist if you have questions. COMMON BRAND NAME(S): Paraplatin What should I tell my health care provider before I take this medicine? They need to know if you have any of these conditions: -blood disorders -hearing problems -kidney disease -recent or ongoing radiation therapy -an unusual or allergic reaction to carboplatin, cisplatin, other chemotherapy, other medicines, foods, dyes, or preservatives -pregnant or trying to get pregnant -breast-feeding How should I use this  medicine? This drug is usually given as an infusion into a vein. It is administered in a hospital or clinic by a specially trained health care professional. Talk to your pediatrician regarding the use of this medicine in children. Special care may be needed. Overdosage: If you think you have taken too much of this medicine contact a poison control center or emergency room at once. NOTE: This medicine is only for you. Do not share this medicine with others. What if I miss a dose? It is important not to miss a dose. Call your doctor or health care professional if you are unable to keep an appointment. What may interact with this medicine? -medicines for seizures -medicines to increase blood counts like filgrastim, pegfilgrastim, sargramostim -some antibiotics like amikacin, gentamicin, neomycin, streptomycin, tobramycin -vaccines Talk to your doctor or health care professional before taking any of these medicines: -acetaminophen -aspirin -ibuprofen -ketoprofen -naproxen This list may not describe all possible interactions. Give your health care provider a list of all the medicines, herbs, non-prescription drugs, or dietary supplements you use. Also tell them if you smoke, drink alcohol, or use illegal drugs. Some items may interact with your medicine. What should I watch for while using this medicine? Your condition will be monitored carefully while you are receiving this medicine. You will need important blood work done while you are taking this medicine. This drug may make you feel generally unwell. This is not uncommon, as chemotherapy can affect healthy cells as well as cancer cells. Report any side effects. Continue your course of treatment even though you feel ill unless your doctor tells you to stop. In some cases, you may be given additional medicines to help with side effects. Follow all directions for their use. Call your doctor or health care professional for advice if you get a  fever, chills or sore throat, or other symptoms of a cold or flu. Do not treat yourself. This drug decreases your body's ability to fight infections. Try to avoid being around people who are sick. This medicine may increase your risk to bruise or bleed. Call your doctor or health care professional if you notice any unusual bleeding. Be careful brushing and flossing your teeth or using a toothpick because you may get an infection or bleed more easily. If you have any dental work done, tell your dentist you are receiving this medicine. Avoid taking products that contain aspirin, acetaminophen, ibuprofen, naproxen, or ketoprofen unless instructed by your doctor. These medicines may hide a fever. Do not become pregnant while taking this medicine. Women should inform their doctor if they wish to become pregnant or think they might be pregnant. There is a potential for serious side effects to an unborn child. Talk to your health care professional or pharmacist for more information. Do not breast-feed an infant while taking this medicine. What side effects may I notice from receiving this medicine? Side effects that you should report to your doctor or health care professional as soon as possible: -allergic reactions like skin rash, itching or hives, swelling   of the face, lips, or tongue -signs of infection - fever or chills, cough, sore throat, pain or difficulty passing urine -signs of decreased platelets or bleeding - bruising, pinpoint red spots on the skin, black, tarry stools, nosebleeds -signs of decreased red blood cells - unusually weak or tired, fainting spells, lightheadedness -breathing problems -changes in hearing -changes in vision -chest pain -high blood pressure -low blood counts - This drug may decrease the number of white blood cells, red blood cells and platelets. You may be at increased risk for infections and bleeding. -nausea and vomiting -pain, swelling, redness or irritation at the  injection site -pain, tingling, numbness in the hands or feet -problems with balance, talking, walking -trouble passing urine or change in the amount of urine Side effects that usually do not require medical attention (report to your doctor or health care professional if they continue or are bothersome): -hair loss -loss of appetite -metallic taste in the mouth or changes in taste This list may not describe all possible side effects. Call your doctor for medical advice about side effects. You may report side effects to FDA at 1-800-FDA-1088. Where should I keep my medicine? This drug is given in a hospital or clinic and will not be stored at home. NOTE: This sheet is a summary. It may not cover all possible information. If you have questions about this medicine, talk to your doctor, pharmacist, or health care provider.  2018 Elsevier/Gold Standard (2008-01-20 14:38:05)  

## 2017-01-17 ENCOUNTER — Telehealth: Payer: Self-pay | Admitting: Medical Oncology

## 2017-01-17 ENCOUNTER — Ambulatory Visit (HOSPITAL_COMMUNITY)
Admission: RE | Admit: 2017-01-17 | Discharge: 2017-01-17 | Disposition: A | Discharge status: Home / Self Care | Payer: Medicare HMO | Source: Ambulatory Visit | Attending: Internal Medicine | Admitting: Internal Medicine

## 2017-01-17 DIAGNOSIS — E279 Disorder of adrenal gland, unspecified: Secondary | ICD-10-CM | POA: Insufficient documentation

## 2017-01-17 DIAGNOSIS — J90 Pleural effusion, not elsewhere classified: Secondary | ICD-10-CM | POA: Insufficient documentation

## 2017-01-17 DIAGNOSIS — I700 Atherosclerosis of aorta: Secondary | ICD-10-CM | POA: Diagnosis not present

## 2017-01-17 DIAGNOSIS — I2510 Atherosclerotic heart disease of native coronary artery without angina pectoris: Secondary | ICD-10-CM | POA: Diagnosis not present

## 2017-01-17 DIAGNOSIS — Z7189 Other specified counseling: Secondary | ICD-10-CM | POA: Diagnosis not present

## 2017-01-17 DIAGNOSIS — Z5111 Encounter for antineoplastic chemotherapy: Secondary | ICD-10-CM | POA: Insufficient documentation

## 2017-01-17 DIAGNOSIS — R590 Localized enlarged lymph nodes: Secondary | ICD-10-CM | POA: Diagnosis not present

## 2017-01-17 DIAGNOSIS — C3491 Malignant neoplasm of unspecified part of right bronchus or lung: Secondary | ICD-10-CM | POA: Diagnosis not present

## 2017-01-17 MED ORDER — IOPAMIDOL (ISOVUE-300) INJECTION 61%
30.0000 mL | Freq: Once | INTRAVENOUS | Status: AC | PRN
  Administered 2017-01-17: 30 mL via ORAL

## 2017-01-17 MED ORDER — IOPAMIDOL (ISOVUE-300) INJECTION 61%
INTRAVENOUS | Status: AC
  Administered 2017-01-17: 100 mL
  Filled 2017-01-17: qty 100

## 2017-01-17 MED ORDER — IOPAMIDOL (ISOVUE-300) INJECTION 61%
INTRAVENOUS | Status: AC
  Administered 2017-01-17: 30 mL via ORAL
  Filled 2017-01-17: qty 30

## 2017-01-17 NOTE — Telephone Encounter (Signed)
Slept well last night -" I have not had any reactions except a dry mouth". I encouraged pt to maintain fluid intake and to call if he has problems maintaining po intake or any other symptoms.

## 2017-01-17 NOTE — Telephone Encounter (Signed)
e

## 2017-01-21 ENCOUNTER — Ambulatory Visit (HOSPITAL_BASED_OUTPATIENT_CLINIC_OR_DEPARTMENT_OTHER): Payer: Medicare HMO

## 2017-01-21 ENCOUNTER — Telehealth: Payer: Self-pay | Admitting: Medical Oncology

## 2017-01-21 ENCOUNTER — Ambulatory Visit (HOSPITAL_BASED_OUTPATIENT_CLINIC_OR_DEPARTMENT_OTHER): Payer: Medicare HMO | Admitting: Nurse Practitioner

## 2017-01-21 ENCOUNTER — Other Ambulatory Visit: Payer: Self-pay | Admitting: *Deleted

## 2017-01-21 VITALS — BP 134/80 | HR 86 | Temp 98.3°F | Resp 18 | Ht 71.0 in | Wt 191.4 lb

## 2017-01-21 DIAGNOSIS — R300 Dysuria: Secondary | ICD-10-CM

## 2017-01-21 DIAGNOSIS — R350 Frequency of micturition: Secondary | ICD-10-CM

## 2017-01-21 DIAGNOSIS — C3491 Malignant neoplasm of unspecified part of right bronchus or lung: Secondary | ICD-10-CM

## 2017-01-21 DIAGNOSIS — C3411 Malignant neoplasm of upper lobe, right bronchus or lung: Secondary | ICD-10-CM | POA: Diagnosis not present

## 2017-01-21 LAB — URINALYSIS, MICROSCOPIC - CHCC
BILIRUBIN (URINE): NEGATIVE
Glucose: NEGATIVE mg/dL
Ketones: NEGATIVE mg/dL
Leukocyte Esterase: NEGATIVE
NITRITE: NEGATIVE
Protein: <30 mg/dL
Specific Gravity, Urine: 1.02 (ref 1.003–1.035)
UROBILINOGEN UR: 0.2 mg/dL (ref 0.2–1)
pH: 6 (ref 4.6–8.0)

## 2017-01-21 LAB — CBC WITH DIFFERENTIAL/PLATELET
BASO%: 0.5 % (ref 0.0–2.0)
Basophils Absolute: 0 10*3/uL (ref 0.0–0.1)
EOS ABS: 0.2 10*3/uL (ref 0.0–0.5)
EOS%: 2.9 % (ref 0.0–7.0)
HCT: 31 % — ABNORMAL LOW (ref 38.4–49.9)
HEMOGLOBIN: 10.2 g/dL — AB (ref 13.0–17.1)
LYMPH%: 24.6 % (ref 14.0–49.0)
MCH: 29 pg (ref 27.2–33.4)
MCHC: 32.7 g/dL (ref 32.0–36.0)
MCV: 88.7 fL (ref 79.3–98.0)
MONO#: 0.1 10*3/uL (ref 0.1–0.9)
MONO%: 1 % (ref 0.0–14.0)
NEUT%: 71 % (ref 39.0–75.0)
NEUTROS ABS: 4.5 10*3/uL (ref 1.5–6.5)
Platelets: 282 10*3/uL (ref 140–400)
RBC: 3.5 10*6/uL — ABNORMAL LOW (ref 4.20–5.82)
RDW: 16.2 % — AB (ref 11.0–14.6)
WBC: 6.3 10*3/uL (ref 4.0–10.3)
lymph#: 1.6 10*3/uL (ref 0.9–3.3)

## 2017-01-21 LAB — COMPREHENSIVE METABOLIC PANEL
ALT: 15 U/L (ref 0–55)
AST: 24 U/L (ref 5–34)
Albumin: 3.5 g/dL (ref 3.5–5.0)
Alkaline Phosphatase: 63 U/L (ref 40–150)
Anion Gap: 10 mEq/L (ref 3–11)
BILIRUBIN TOTAL: 0.62 mg/dL (ref 0.20–1.20)
BUN: 24.5 mg/dL (ref 7.0–26.0)
CO2: 27 meq/L (ref 22–29)
Calcium: 10.2 mg/dL (ref 8.4–10.4)
Chloride: 103 mEq/L (ref 98–109)
Creatinine: 1 mg/dL (ref 0.7–1.3)
EGFR: 86 mL/min/{1.73_m2} — AB (ref >90)
GLUCOSE: 77 mg/dL (ref 70–140)
POTASSIUM: 4 meq/L (ref 3.5–5.1)
SODIUM: 140 meq/L (ref 136–145)
TOTAL PROTEIN: 6.9 g/dL (ref 6.4–8.3)

## 2017-01-21 MED ORDER — PHENAZOPYRIDINE HCL 200 MG PO TABS: 200.0000 mg | ORAL_TABLET | Freq: Three times a day (TID) | ORAL | 0 refills | Status: DC | PRN

## 2017-01-21 NOTE — Progress Notes (Signed)
H/o prostate problems. Starting Friday, 01/18/17 @ night -pt experiencing painful urination, up to bathroom to void every other hour, stream starts and stops, pt feels that he does not completely empty his bladder when voids.  On flomax.  Does not want to drink as much fluid because of getting up and down so many times at night. Denies fever/chills/no blood in urine.  No other symptoms.

## 2017-01-21 NOTE — Telephone Encounter (Signed)
I returned pt call. " My prostate is giving me trouble, frequent urination with burning and stream is very small". Denies flank pain.

## 2017-01-22 ENCOUNTER — Telehealth: Payer: Self-pay | Admitting: Medical Oncology

## 2017-01-22 NOTE — Telephone Encounter (Signed)
I was in middle of conversation with pt and got disconnected . I tried to call back and no ring tone. I called his sister and told her that pt does not need to come tomorrow because he came today. She will notify pt.

## 2017-01-23 ENCOUNTER — Encounter: Payer: Self-pay | Admitting: Nurse Practitioner

## 2017-01-23 ENCOUNTER — Other Ambulatory Visit: Payer: Medicare HMO

## 2017-01-23 ENCOUNTER — Telehealth: Payer: Self-pay | Admitting: Nurse Practitioner

## 2017-01-23 ENCOUNTER — Ambulatory Visit: Payer: Medicare HMO | Admitting: Internal Medicine

## 2017-01-23 DIAGNOSIS — R300 Dysuria: Secondary | ICD-10-CM | POA: Insufficient documentation

## 2017-01-23 LAB — URINE CULTURE: ORGANISM ID, BACTERIA: NO GROWTH

## 2017-01-23 NOTE — Progress Notes (Signed)
SYMPTOM MANAGEMENT CLINIC    Chief Complaint: Dysuria  HPI:  Jim [name] 71 y.o. male diagnosed with lung cancer with metastasis.  Currently undergoing carboplatin/Alimta chemotherapy regimen.    No history exists.    Review of Systems  Genitourinary: Positive for dysuria, frequency and urgency.  All other systems reviewed and are negative.   Past Medical History:  Diagnosis Date  . Adenocarcinoma of right lung, stage 4 ([place]) 01/03/2017  . Alcohol abuse 02/06/2011   with alcohol withdrawal during hospital stay  . Angioedema 02/06/2011   lisinopril  . Cardiomyopathy 02/06/2011   Cardiomyopathy with systolic heart failure, newly diagnosed  . Confusion 02/06/2011   possibly secondary to cerebrovascular accident  versus dementia  . CVA (cerebral infarction) 02/06/2011   Ischemic acute infarct in right anterior cerebral artery territory  . Diabetes mellitus   . Encounter for antineoplastic chemotherapy 01/03/2017  . Goals of care, counseling/discussion 01/03/2017  . Hypertension    Mild LVH per echo  . Kidney disease, chronic, stage III (GFR 30-59 ml/min)   . Left bundle branch block 02/06/2011  . Marijuana abuse 02/06/2011  . Nonischemic cardiomyopathy (HCC)    EF is 30 to 35%  . Periodontal disease   . Periodontal disease    extensive  . Tobacco abuse     Past Surgical History:  Procedure Laterality Date  . US ECHOCARDIOGRAPHY  April 2012   EF 30 to 35 %    has Nonischemic cardiomyopathy [facility]); CVA (cerebral infarction); Kidney disease, chronic, stage III (GFR 30-59 ml/min); Hypertension; Multiple substance abuse; Adenocarcinoma of right lung, stage 4 ([place]); Goals of care, counseling/discussion; Encounter for antineoplastic chemotherapy; and Dysuria on his problem list.    is allergic to metformin and related and lisinopril.  Allergies as of 01/21/2017      Reactions   Metformin And Related Other (See Comments)   Blood sugar went up   Lisinopril    Angioedema      Medication List       Accurate as of 01/21/17 11:59 PM. Always use your most recent med list.          albuterol 108 (90 Base) MCG/ACT inhaler Commonly known as:  PROVENTIL HFA;VENTOLIN HFA Inhale 1 puff into the lungs every 6 (six) hours as needed.   aspirin 81 MG tablet Take 81 mg by mouth daily.   buPROPion 100 MG 12 hr tablet Commonly known as:  WELLBUTRIN SR Take 100 mg by mouth 2 (two) times daily.   carvedilol 12.5 MG tablet Commonly known as:  COREG Take 1 tablet by mouth daily.   clopidogrel 75 MG tablet Commonly known as:  PLAVIX Take 75 mg by mouth daily with breakfast.   dexamethasone 4 MG tablet Commonly known as:  DECADRON 4 mg by mouth twice a day the day before, day of and day after the chemotherapy every 3 weeks   diphenhydrAMINE 25 MG tablet Commonly known as:  BENADRYL Take 1 tablet (25 mg total) by mouth every 6 (six) hours.   famotidine 20 MG tablet Commonly known as:  PEPCID Take 1 tablet (20 mg total) by mouth 2 (two) times daily.   folic acid 1 MG tablet Commonly known as:  FOLVITE Take 1 tablet (1 mg total) by mouth daily.   hydrALAZINE 25 MG tablet Commonly known as:  APRESOLINE Take 1 tablet (25 mg total) by mouth 2 (two) times daily.   hydrochlorothiazide 25 MG tablet Commonly known as:  HYDRODIURIL Take 25 mg  by mouth daily.   Insulin Glargine 100 UNIT/ML Solostar Pen Commonly known as:  LANTUS Inject 14 Units into the skin 2 (two) times daily. Inject 28 units daily   isosorbide mononitrate 30 MG 24 hr tablet Commonly known as:  IMDUR Take 1 tablet (30 mg total) by mouth daily.   isosorbide mononitrate 30 MG 24 hr tablet Commonly known as:  IMDUR TAKE 1 TABLET BY MOUTH DAILY   multivitamin per tablet Take 1 tablet by mouth daily.   nitroGLYCERIN 0.4 MG SL tablet Commonly known as:  NITROSTAT Place 0.4 mg under the tongue every 5 (five) minutes as needed for chest pain.   phenazopyridine 200 MG tablet Commonly known  as:  PYRIDIUM Take 1 tablet (200 mg total) by mouth 3 (three) times daily as needed for pain.   prochlorperazine 10 MG tablet Commonly known as:  COMPAZINE Take 1 tablet (10 mg total) by mouth every 6 (six) hours as needed for nausea or vomiting.   rosuvastatin 10 MG tablet Commonly known as:  CRESTOR Take 10 mg by mouth daily.   tamsulosin 0.4 MG Caps capsule Commonly known as:  FLOMAX   traMADol 50 MG tablet Commonly known as:  ULTRAM Take 50 mg by mouth every 8 (eight) hours as needed for moderate pain.   traZODone 50 MG tablet Commonly known as:  DESYREL Take 50 mg by mouth at bedtime. Take 1/2 to 1 tablet hs        PHYSICAL EXAMINATION  Oncology Vitals 01/21/2017 01/16/2017  Height 180 cm -  Weight 86.818 kg -  Weight (lbs) 191 lbs 6 oz -  BMI (kg/m2) 26.69 kg/m2 -  Temp 98.3 98.3  Pulse 86 87  Resp 18 20  SpO2 100 100  BSA (m2) 2.09 m2 -   BP Readings from Last 2 Encounters:  01/21/17 134/80  01/16/17 (!) 134/93    Physical Exam  Constitutional: He is oriented to person, place, and time and well-developed, well-nourished, and in no distress.  HENT:  Head: Normocephalic and atraumatic.  Eyes: Conjunctivae and EOM are normal. Pupils are equal, round, and reactive to light.  Neck: Normal range of motion.  Pulmonary/Chest: Effort normal. No respiratory distress.  Musculoskeletal: Normal range of motion. He exhibits no edema or tenderness.  Neurological: He is alert and oriented to person, place, and time.  Skin: Skin is warm and dry.  Psychiatric: Affect normal.  Nursing note and vitals reviewed.   LABORATORY DATA:. Appointment on 01/21/2017  Component Date Value Ref Range Status  . WBC 01/21/2017 6.3  4.0 - 10.3 10e3/uL Final  . NEUT# 01/21/2017 4.5  1.5 - 6.5 10e3/uL Final  . HGB 01/21/2017 10.2* 13.0 - 17.1 g/dL Final  . HCT 01/21/2017 31.0* 38.4 - 49.9 % Final  . Platelets 01/21/2017 282  140 - 400 10e3/uL Final  . MCV 01/21/2017 88.7  79.3 - 98.0  fL Final  . MCH 01/21/2017 29.0  27.2 - 33.4 pg Final  . MCHC 01/21/2017 32.7  32.0 - 36.0 g/dL Final  . RBC 01/21/2017 3.50* 4.20 - 5.82 10e6/uL Final  . RDW 01/21/2017 16.2* 11.0 - 14.6 % Final  . lymph# 01/21/2017 1.6  0.9 - 3.3 10e3/uL Final  . MONO# 01/21/2017 0.1  0.1 - 0.9 10e3/uL Final  . Eosinophils Absolute 01/21/2017 0.2  0.0 - 0.5 10e3/uL Final  . Basophils Absolute 01/21/2017 0.0  0.0 - 0.1 10e3/uL Final  . NEUT% 01/21/2017 71.0  39.0 - 75.0 % Final  . LYMPH%  01/21/2017 24.6  14.0 - 49.0 % Final  . MONO% 01/21/2017 1.0  0.0 - 14.0 % Final  . EOS% 01/21/2017 2.9  0.0 - 7.0 % Final  . BASO% 01/21/2017 0.5  0.0 - 2.0 % Final  . Sodium 01/21/2017 140  136 - 145 mEq/L Final  . Potassium 01/21/2017 4.0  3.5 - 5.1 mEq/L Final  . Chloride 01/21/2017 103  98 - 109 mEq/L Final  . CO2 01/21/2017 27  22 - 29 mEq/L Final  . Glucose 01/21/2017 77  70 - 140 mg/dl Final  . BUN 01/21/2017 24.5  7.0 - 26.0 mg/dL Final  . Creatinine 01/21/2017 1.0  0.7 - 1.3 mg/dL Final  . Total Bilirubin 01/21/2017 0.62  0.20 - 1.20 mg/dL Final  . Alkaline Phosphatase 01/21/2017 63  40 - 150 U/L Final  . AST 01/21/2017 24  5 - 34 U/L Final  . ALT 01/21/2017 15  0 - 55 U/L Final  . Total Protein 01/21/2017 6.9  6.4 - 8.3 g/dL Final  . Albumin 01/21/2017 3.5  3.5 - 5.0 g/dL Final  . Calcium 01/21/2017 10.2  8.4 - 10.4 mg/dL Final  . Anion Gap 01/21/2017 10  3 - 11 mEq/L Final  . EGFR 01/21/2017 86* >90 ml/min/1.73 m2 Final  . Urine Culture, Routine 01/21/2017 Final report   Final  . Urine Culture result 1 01/21/2017 No growth   Final  . Glucose 01/21/2017 Negative  Negative mg/dL Final  . Bilirubin (Urine) 01/21/2017 Negative  Negative Final  . Ketones 01/21/2017 Negative  Negative mg/dL Final  . Specific Gravity, Urine 01/21/2017 1.020  1.003 - 1.035 Final  . Blood 01/21/2017 Trace  Negative Final  . pH 01/21/2017 6.0  4.6 - 8.0 Final  . Protein 01/21/2017 < 30  Negative- <30 mg/dL Final  .  Urobilinogen, UR 01/21/2017 0.2  0.2 - 1 mg/dL Final  . Nitrite 01/21/2017 Negative  Negative Final  . Leukocyte Esterase 01/21/2017 Negative  Negative Final  . RBC / HPF 01/21/2017 3-6  0 - 2 Final  . WBC, UA 01/21/2017 0-2  0-2;Negative Final  . Bacteria, UA 01/21/2017 Few  Negative- Trace Final  . Epithelial Cells 01/21/2017 Few  Negative- Few Final  . Mucus, UA 01/21/2017 Small  Negative- Small Final    RADIOGRAPHIC STUDIES: No results found.  ASSESSMENT/PLAN:    Dysuria Patient presented to the [facility] today with complaint of dysuria, frequency of urination, and stating that his urinary stream starts and stops thousand.  He states he is getting up several times per night to urinate as well.  He denies any recent fevers or chills.  Denies any other new symptoms as well.  He states he has a history of prostate problems in the past; he continues to take Flomax as previously directed.  Patient has seen Dr. Karsten Ro urologist in the past.  On exam today.  Patient has no abdominal discomfort or flank pain.  Patient is afebrile.  Vital signs are stable.  Urinalysis reveals no acute UTI.  Awaiting culture results.  Dr. Julien Nordmann in for brief exam as well; and recommended that patient be given Pyridium for treatment of any bladder spasms.  Also, this provider will follow up with [facility] to see if patient can get an appointment with him for further evaluation.  Also recommended the patient follow-up with his primary care provider as well.  In the meantime-patient was advised to call/return or go directly to the emergency department for any worsening symptoms  whatsoever.   Patient stated understanding of all instructions; and was in agreement with this plan of care. The patient knows to call the clinic with any problems, questions or concerns.   Total time spent with patient was 25 minutes;  with greater than 75 percent of that time spent in face to face counseling regarding  patient's symptoms,  and coordination of care and follow up.  Disclaimer:This dictation was prepared with Dragon/digital dictation along with [employer]. Any transcriptional errors that result from this process are unintentional.  Drue Second, NP 01/23/2017   ADDENDUM: Hematology/Oncology Attending: I had a face to face encounter with the patient. I recommended his care plan. This is a very pleasant 71 years old African-American male who was recently diagnosed with a stage IV non-small cell lung cancer, adenocarcinoma. He was started on systemic chemotherapy was carboplatin and Alimta last week. He tolerated the first week of his treatment fairly well. He had repeat imaging studies including CT scan of the head, chest abdomen and pelvis for restaging of his disease before starting his treatment and it showed findings consistent with a stage IV non-small cell lung cancer. I discussed the scan results with the patient today. I recommended for him to continue his current treatment with carboplatin and Alimta as a scheduled and he is expected to start cycle #2 in 2 weeks. He presented today with dysuria and increased frequency. We will order urinalysis as well as culture and sensitivities that were negative. We will start the patient and particularly on Pyridium. We will also referred the patient to [facility] for evaluation of his condition. He would come back for follow-up visit in 2 weeks for evaluation with the next cycle of his treatment. He was advised to call immediately if he has any concerning symptoms in the interval. Disclaimer: This note was dictated with voice recognition software. Similar sounding words can inadvertently be transcribed and may be missed upon review. Eilleen Kempf., MD 01/23/17

## 2017-01-23 NOTE — Assessment & Plan Note (Signed)
Patient presented to the [facility] today with complaint of dysuria, frequency of urination, and stating that his urinary stream starts and stops thousand.  He states he is getting up several times per night to urinate as well.  He denies any recent fevers or chills.  Denies any other new symptoms as well.  He states he has a history of prostate problems in the past; he continues to take Flomax as previously directed.  Patient has seen Dr. Karsten Ro urologist in the past.  On exam today.  Patient has no abdominal discomfort or flank pain.  Patient is afebrile.  Vital signs are stable.  Urinalysis reveals no acute UTI.  Awaiting culture results.  Dr. Julien Nordmann in for brief exam as well; and recommended that patient be given Pyridium for treatment of any bladder spasms.  Also, this provider will follow up with [facility] to see if patient can get an appointment with him for further evaluation.  Also recommended the patient follow-up with his primary care provider as well.  In the meantime-patient was advised to call/return or go directly to the emergency department for any worsening symptoms whatsoever.

## 2017-01-23 NOTE — Telephone Encounter (Signed)
Called patient to check in on him.  He states he has been taking the Pyridium with minimal effectiveness.  He states that he continues with some mild dysuria and frequency of urination.  He states he still gets up frequently at night to urinate as well.  He continues to deny any fevers or chills.  Advised patient to both his urinalysis and his urine culture were negative.  Also, advised patient that this provider called [facility] and requested an urgent visit for further evaluation.  Apparently, patient has seen Dr. Karsten Ro urologist in the past.  [facility].  Triage nurse stated that she would put the patient on the cancellation list and they would contact the patient as soon as there is an available appointment.  Also, patient was advised to follow-up with his primary care physician in the interim as well.  Patient stated understanding of all instructions; and was in agreement with this plan of care.

## 2017-01-28 ENCOUNTER — Telehealth: Payer: Self-pay | Admitting: Internal Medicine

## 2017-01-28 NOTE — Telephone Encounter (Signed)
Left message confirming next two appointments for 4/4 and 4/11. Patient to get new schedule at next appointment.

## 2017-01-30 ENCOUNTER — Other Ambulatory Visit (HOSPITAL_BASED_OUTPATIENT_CLINIC_OR_DEPARTMENT_OTHER): Payer: Medicare HMO

## 2017-01-30 ENCOUNTER — Ambulatory Visit: Payer: Medicare HMO

## 2017-01-30 DIAGNOSIS — C3491 Malignant neoplasm of unspecified part of right bronchus or lung: Secondary | ICD-10-CM

## 2017-01-30 DIAGNOSIS — C3411 Malignant neoplasm of upper lobe, right bronchus or lung: Secondary | ICD-10-CM | POA: Diagnosis not present

## 2017-01-30 LAB — CBC WITH DIFFERENTIAL/PLATELET
BASO%: 0.5 % (ref 0.0–2.0)
Basophils Absolute: 0 10*3/uL (ref 0.0–0.1)
EOS%: 2.5 % (ref 0.0–7.0)
Eosinophils Absolute: 0.1 10*3/uL (ref 0.0–0.5)
HCT: 27.1 % — ABNORMAL LOW (ref 38.4–49.9)
HGB: 8.8 g/dL — ABNORMAL LOW (ref 13.0–17.1)
LYMPH#: 0.5 10*3/uL — AB (ref 0.9–3.3)
LYMPH%: 10.9 % — AB (ref 14.0–49.0)
MCH: 28.1 pg (ref 27.2–33.4)
MCHC: 32.3 g/dL (ref 32.0–36.0)
MCV: 87 fL (ref 79.3–98.0)
MONO#: 0.2 10*3/uL (ref 0.1–0.9)
MONO%: 4.4 % (ref 0.0–14.0)
NEUT%: 81.7 % — ABNORMAL HIGH (ref 39.0–75.0)
NEUTROS ABS: 3.6 10*3/uL (ref 1.5–6.5)
PLATELETS: 212 10*3/uL (ref 140–400)
RBC: 3.12 10*6/uL — AB (ref 4.20–5.82)
RDW: 15.9 % — ABNORMAL HIGH (ref 11.0–14.6)
WBC: 4.4 10*3/uL (ref 4.0–10.3)

## 2017-01-30 LAB — COMPREHENSIVE METABOLIC PANEL
ALBUMIN: 3.3 g/dL — AB (ref 3.5–5.0)
ALK PHOS: 73 U/L (ref 40–150)
ALT: 16 U/L (ref 0–55)
AST: 23 U/L (ref 5–34)
Anion Gap: 13 mEq/L — ABNORMAL HIGH (ref 3–11)
BUN: 15.7 mg/dL (ref 7.0–26.0)
CALCIUM: 9.8 mg/dL (ref 8.4–10.4)
CO2: 21 mEq/L — ABNORMAL LOW (ref 22–29)
Chloride: 109 mEq/L (ref 98–109)
Creatinine: 1 mg/dL (ref 0.7–1.3)
EGFR: 87 mL/min/{1.73_m2} — AB (ref >90)
Glucose: 188 mg/dl — ABNORMAL HIGH (ref 70–140)
POTASSIUM: 3.9 meq/L (ref 3.5–5.1)
Sodium: 143 mEq/L (ref 136–145)
Total Bilirubin: 0.32 mg/dL (ref 0.20–1.20)
Total Protein: 7.1 g/dL (ref 6.4–8.3)

## 2017-02-06 ENCOUNTER — Ambulatory Visit: Payer: Medicare HMO

## 2017-02-06 ENCOUNTER — Other Ambulatory Visit: Payer: Medicare HMO

## 2017-02-06 ENCOUNTER — Telehealth: Payer: Self-pay | Admitting: Medical Oncology

## 2017-02-06 ENCOUNTER — Ambulatory Visit: Payer: Medicare HMO | Admitting: Internal Medicine

## 2017-02-06 NOTE — Telephone Encounter (Signed)
"  I AM A LITTLE UNDER THE WEATHER"-Dx with UTI still getting hs but better . Appt with urology 4/17th. Pt cancelled appt today . Schedule request sent.

## 2017-02-11 ENCOUNTER — Telehealth: Payer: Self-pay | Admitting: Internal Medicine

## 2017-02-11 NOTE — Telephone Encounter (Signed)
Scheduled appts per sch message from Rn diane 4/11. - Patients appt needed to be rescheduled and all other appts adjusted.- patietn is aware of next appt and additional appts added and will pick up new schedule 4/20 .

## 2017-02-13 ENCOUNTER — Other Ambulatory Visit: Payer: Medicare HMO

## 2017-02-15 ENCOUNTER — Other Ambulatory Visit (HOSPITAL_BASED_OUTPATIENT_CLINIC_OR_DEPARTMENT_OTHER): Payer: Medicare HMO

## 2017-02-15 ENCOUNTER — Ambulatory Visit (HOSPITAL_BASED_OUTPATIENT_CLINIC_OR_DEPARTMENT_OTHER): Payer: Medicare HMO

## 2017-02-15 VITALS — BP 132/82 | HR 88 | Temp 98.1°F | Resp 18

## 2017-02-15 DIAGNOSIS — Z5111 Encounter for antineoplastic chemotherapy: Secondary | ICD-10-CM

## 2017-02-15 DIAGNOSIS — C3491 Malignant neoplasm of unspecified part of right bronchus or lung: Secondary | ICD-10-CM

## 2017-02-15 DIAGNOSIS — C3411 Malignant neoplasm of upper lobe, right bronchus or lung: Secondary | ICD-10-CM

## 2017-02-15 LAB — COMPREHENSIVE METABOLIC PANEL
ALT: 10 U/L (ref 0–55)
AST: 14 U/L (ref 5–34)
Albumin: 3.3 g/dL — ABNORMAL LOW (ref 3.5–5.0)
Alkaline Phosphatase: 74 U/L (ref 40–150)
Anion Gap: 12 mEq/L — ABNORMAL HIGH (ref 3–11)
BUN: 19.6 mg/dL (ref 7.0–26.0)
CALCIUM: 10.3 mg/dL (ref 8.4–10.4)
CHLORIDE: 108 meq/L (ref 98–109)
CO2: 24 meq/L (ref 22–29)
CREATININE: 1 mg/dL (ref 0.7–1.3)
EGFR: 87 mL/min/{1.73_m2} — ABNORMAL LOW (ref >90)
GLUCOSE: 155 mg/dL — AB (ref 70–140)
POTASSIUM: 4.1 meq/L (ref 3.5–5.1)
Sodium: 144 mEq/L (ref 136–145)
Total Bilirubin: 0.3 mg/dL (ref 0.20–1.20)
Total Protein: 7.2 g/dL (ref 6.4–8.3)

## 2017-02-15 LAB — CBC WITH DIFFERENTIAL/PLATELET
BASO%: 0.3 % (ref 0.0–2.0)
Basophils Absolute: 0 10*3/uL (ref 0.0–0.1)
EOS%: 0 % (ref 0.0–7.0)
Eosinophils Absolute: 0 10*3/uL (ref 0.0–0.5)
HEMATOCRIT: 31.2 % — AB (ref 38.4–49.9)
HGB: 10.1 g/dL — ABNORMAL LOW (ref 13.0–17.1)
LYMPH#: 0.9 10*3/uL (ref 0.9–3.3)
LYMPH%: 11 % — ABNORMAL LOW (ref 14.0–49.0)
MCH: 28.1 pg (ref 27.2–33.4)
MCHC: 32.4 g/dL (ref 32.0–36.0)
MCV: 86.9 fL (ref 79.3–98.0)
MONO#: 0.7 10*3/uL (ref 0.1–0.9)
MONO%: 8.9 % (ref 0.0–14.0)
NEUT#: 6.3 10*3/uL (ref 1.5–6.5)
NEUT%: 79.8 % — AB (ref 39.0–75.0)
Platelets: 360 10*3/uL (ref 140–400)
RBC: 3.58 10*6/uL — AB (ref 4.20–5.82)
RDW: 17.4 % — ABNORMAL HIGH (ref 11.0–14.6)
WBC: 7.9 10*3/uL (ref 4.0–10.3)

## 2017-02-15 MED ORDER — PALONOSETRON HCL INJECTION 0.25 MG/5ML
INTRAVENOUS | Status: AC
  Filled 2017-02-15: qty 5

## 2017-02-15 MED ORDER — DEXAMETHASONE SODIUM PHOSPHATE 10 MG/ML IJ SOLN
INTRAMUSCULAR | Status: AC
  Filled 2017-02-15: qty 1

## 2017-02-15 MED ORDER — PEMETREXED DISODIUM CHEMO INJECTION 500 MG
475.0000 mg/m2 | Freq: Once | INTRAVENOUS | Status: AC
  Administered 2017-02-15: 1000 mg via INTRAVENOUS
  Filled 2017-02-15: qty 40

## 2017-02-15 MED ORDER — CARBOPLATIN CHEMO INJECTION 600 MG/60ML
544.5000 mg | Freq: Once | INTRAVENOUS | Status: AC
  Administered 2017-02-15: 540 mg via INTRAVENOUS
  Filled 2017-02-15: qty 54

## 2017-02-15 MED ORDER — DEXAMETHASONE SODIUM PHOSPHATE 10 MG/ML IJ SOLN
10.0000 mg | Freq: Once | INTRAMUSCULAR | Status: AC
  Administered 2017-02-15: 10 mg via INTRAVENOUS

## 2017-02-15 MED ORDER — HEPARIN SOD (PORK) LOCK FLUSH 100 UNIT/ML IV SOLN
500.0000 [IU] | Freq: Once | INTRAVENOUS | Status: DC | PRN
  Filled 2017-02-15: qty 5

## 2017-02-15 MED ORDER — SODIUM CHLORIDE 0.9 % IV SOLN
Freq: Once | INTRAVENOUS | Status: AC
  Administered 2017-02-15: 14:00:00 via INTRAVENOUS

## 2017-02-15 MED ORDER — PALONOSETRON HCL INJECTION 0.25 MG/5ML
0.2500 mg | Freq: Once | INTRAVENOUS | Status: AC
  Administered 2017-02-15: 0.25 mg via INTRAVENOUS

## 2017-02-15 MED ORDER — SODIUM CHLORIDE 0.9% FLUSH
10.0000 mL | INTRAVENOUS | Status: DC | PRN
Start: 2017-02-15 — End: 2017-02-15
  Filled 2017-02-15: qty 10

## 2017-02-20 ENCOUNTER — Other Ambulatory Visit (HOSPITAL_BASED_OUTPATIENT_CLINIC_OR_DEPARTMENT_OTHER): Payer: Medicare HMO

## 2017-02-20 DIAGNOSIS — C3491 Malignant neoplasm of unspecified part of right bronchus or lung: Secondary | ICD-10-CM | POA: Diagnosis not present

## 2017-02-20 LAB — CBC WITH DIFFERENTIAL/PLATELET
BASO%: 0.4 % (ref 0.0–2.0)
Basophils Absolute: 0 10*3/uL (ref 0.0–0.1)
EOS%: 1.5 % (ref 0.0–7.0)
Eosinophils Absolute: 0.1 10*3/uL (ref 0.0–0.5)
HCT: 31.1 % — ABNORMAL LOW (ref 38.4–49.9)
HEMOGLOBIN: 10.1 g/dL — AB (ref 13.0–17.1)
LYMPH%: 15.5 % (ref 14.0–49.0)
MCH: 28.2 pg (ref 27.2–33.4)
MCHC: 32.4 g/dL (ref 32.0–36.0)
MCV: 86.9 fL (ref 79.3–98.0)
MONO#: 0.1 10*3/uL (ref 0.1–0.9)
MONO%: 1.8 % (ref 0.0–14.0)
NEUT#: 5.8 10*3/uL (ref 1.5–6.5)
NEUT%: 80.8 % — ABNORMAL HIGH (ref 39.0–75.0)
Platelets: 265 10*3/uL (ref 140–400)
RBC: 3.58 10*6/uL — ABNORMAL LOW (ref 4.20–5.82)
RDW: 16.8 % — ABNORMAL HIGH (ref 11.0–14.6)
WBC: 7.2 10*3/uL (ref 4.0–10.3)
lymph#: 1.1 10*3/uL (ref 0.9–3.3)

## 2017-02-20 LAB — COMPREHENSIVE METABOLIC PANEL
ALBUMIN: 3.3 g/dL — AB (ref 3.5–5.0)
ALT: 10 U/L (ref 0–55)
AST: 16 U/L (ref 5–34)
Alkaline Phosphatase: 97 U/L (ref 40–150)
Anion Gap: 9 mEq/L (ref 3–11)
BUN: 22 mg/dL (ref 7.0–26.0)
CALCIUM: 10 mg/dL (ref 8.4–10.4)
CHLORIDE: 104 meq/L (ref 98–109)
CO2: 28 mEq/L (ref 22–29)
CREATININE: 1.2 mg/dL (ref 0.7–1.3)
EGFR: 72 mL/min/{1.73_m2} — ABNORMAL LOW (ref >90)
Glucose: 172 mg/dl — ABNORMAL HIGH (ref 70–140)
Potassium: 3.8 mEq/L (ref 3.5–5.1)
Sodium: 141 mEq/L (ref 136–145)
Total Bilirubin: 0.78 mg/dL (ref 0.20–1.20)
Total Protein: 6.8 g/dL (ref 6.4–8.3)

## 2017-02-26 ENCOUNTER — Telehealth: Payer: Self-pay | Admitting: *Deleted

## 2017-02-26 DIAGNOSIS — N4281 Prostatodynia syndrome: Secondary | ICD-10-CM

## 2017-02-26 NOTE — Telephone Encounter (Signed)
Returned call to pt who adised he is having pain with urination, prostate pain and a urine stream that is slow with frequent stop and go. Pt unsure if he is able to completely empty bladder. Reviewed with MD, referral to Urology made. Instructed pt to call PCP office who initially wrote rx for antibiotic for same and inform office he is having similar symptoms. Referral to urology entered.

## 2017-02-27 ENCOUNTER — Other Ambulatory Visit (HOSPITAL_BASED_OUTPATIENT_CLINIC_OR_DEPARTMENT_OTHER): Payer: Medicare HMO

## 2017-02-27 ENCOUNTER — Ambulatory Visit: Payer: Medicare HMO | Admitting: Internal Medicine

## 2017-02-27 ENCOUNTER — Ambulatory Visit: Payer: Medicare HMO

## 2017-02-27 DIAGNOSIS — C3491 Malignant neoplasm of unspecified part of right bronchus or lung: Secondary | ICD-10-CM | POA: Diagnosis not present

## 2017-02-27 LAB — CBC WITH DIFFERENTIAL/PLATELET
BASO%: 0.7 % (ref 0.0–2.0)
BASOS ABS: 0 10*3/uL (ref 0.0–0.1)
EOS ABS: 0.2 10*3/uL (ref 0.0–0.5)
EOS%: 6.1 % (ref 0.0–7.0)
HEMATOCRIT: 28.2 % — AB (ref 38.4–49.9)
HEMOGLOBIN: 9 g/dL — AB (ref 13.0–17.1)
LYMPH%: 22.6 % (ref 14.0–49.0)
MCH: 27.5 pg (ref 27.2–33.4)
MCHC: 32 g/dL (ref 32.0–36.0)
MCV: 86 fL (ref 79.3–98.0)
MONO#: 0.6 10*3/uL (ref 0.1–0.9)
MONO%: 16.3 % — AB (ref 0.0–14.0)
NEUT#: 1.9 10*3/uL (ref 1.5–6.5)
NEUT%: 54.3 % (ref 39.0–75.0)
PLATELETS: 173 10*3/uL (ref 140–400)
RBC: 3.28 10*6/uL — ABNORMAL LOW (ref 4.20–5.82)
RDW: 17.1 % — AB (ref 11.0–14.6)
WBC: 3.5 10*3/uL — ABNORMAL LOW (ref 4.0–10.3)
lymph#: 0.8 10*3/uL — ABNORMAL LOW (ref 0.9–3.3)

## 2017-02-27 LAB — COMPREHENSIVE METABOLIC PANEL
ALBUMIN: 3.3 g/dL — AB (ref 3.5–5.0)
ALK PHOS: 81 U/L (ref 40–150)
ALT: 11 U/L (ref 0–55)
ANION GAP: 10 meq/L (ref 3–11)
AST: 17 U/L (ref 5–34)
BILIRUBIN TOTAL: 0.33 mg/dL (ref 0.20–1.20)
BUN: 14 mg/dL (ref 7.0–26.0)
CALCIUM: 9.8 mg/dL (ref 8.4–10.4)
CO2: 26 mEq/L (ref 22–29)
Chloride: 106 mEq/L (ref 98–109)
Creatinine: 1.1 mg/dL (ref 0.7–1.3)
EGFR: 81 mL/min/{1.73_m2} — AB (ref >90)
Glucose: 115 mg/dl (ref 70–140)
POTASSIUM: 4.1 meq/L (ref 3.5–5.1)
Sodium: 142 mEq/L (ref 136–145)
TOTAL PROTEIN: 6.9 g/dL (ref 6.4–8.3)

## 2017-03-06 ENCOUNTER — Ambulatory Visit (HOSPITAL_BASED_OUTPATIENT_CLINIC_OR_DEPARTMENT_OTHER): Payer: Medicare HMO | Admitting: Internal Medicine

## 2017-03-06 ENCOUNTER — Ambulatory Visit (HOSPITAL_BASED_OUTPATIENT_CLINIC_OR_DEPARTMENT_OTHER): Payer: Medicare HMO

## 2017-03-06 ENCOUNTER — Other Ambulatory Visit (HOSPITAL_BASED_OUTPATIENT_CLINIC_OR_DEPARTMENT_OTHER): Payer: Medicare HMO

## 2017-03-06 ENCOUNTER — Telehealth: Payer: Self-pay | Admitting: Internal Medicine

## 2017-03-06 ENCOUNTER — Other Ambulatory Visit: Payer: Self-pay | Admitting: Internal Medicine

## 2017-03-06 VITALS — BP 149/86 | HR 101 | Temp 97.7°F | Resp 20 | Ht 71.0 in | Wt 184.2 lb

## 2017-03-06 DIAGNOSIS — E119 Type 2 diabetes mellitus without complications: Secondary | ICD-10-CM

## 2017-03-06 DIAGNOSIS — C7971 Secondary malignant neoplasm of right adrenal gland: Secondary | ICD-10-CM

## 2017-03-06 DIAGNOSIS — C3491 Malignant neoplasm of unspecified part of right bronchus or lung: Secondary | ICD-10-CM | POA: Diagnosis not present

## 2017-03-06 DIAGNOSIS — Z5111 Encounter for antineoplastic chemotherapy: Secondary | ICD-10-CM

## 2017-03-06 DIAGNOSIS — C7951 Secondary malignant neoplasm of bone: Secondary | ICD-10-CM

## 2017-03-06 DIAGNOSIS — I10 Essential (primary) hypertension: Secondary | ICD-10-CM

## 2017-03-06 DIAGNOSIS — C7972 Secondary malignant neoplasm of left adrenal gland: Secondary | ICD-10-CM | POA: Diagnosis not present

## 2017-03-06 LAB — CBC WITH DIFFERENTIAL/PLATELET
BASO%: 0.1 % (ref 0.0–2.0)
Basophils Absolute: 0 10*3/uL (ref 0.0–0.1)
EOS%: 0 % (ref 0.0–7.0)
Eosinophils Absolute: 0 10*3/uL (ref 0.0–0.5)
HCT: 30.9 % — ABNORMAL LOW (ref 38.4–49.9)
HGB: 9.9 g/dL — ABNORMAL LOW (ref 13.0–17.1)
LYMPH#: 0.6 10*3/uL — AB (ref 0.9–3.3)
LYMPH%: 9.8 % — AB (ref 14.0–49.0)
MCH: 27.4 pg (ref 27.2–33.4)
MCHC: 31.9 g/dL — ABNORMAL LOW (ref 32.0–36.0)
MCV: 85.7 fL (ref 79.3–98.0)
MONO#: 0.3 10*3/uL (ref 0.1–0.9)
MONO%: 5.1 % (ref 0.0–14.0)
NEUT%: 85 % — ABNORMAL HIGH (ref 39.0–75.0)
NEUTROS ABS: 4.9 10*3/uL (ref 1.5–6.5)
PLATELETS: 543 10*3/uL — AB (ref 140–400)
RBC: 3.61 10*6/uL — AB (ref 4.20–5.82)
RDW: 18.3 % — ABNORMAL HIGH (ref 11.0–14.6)
WBC: 5.8 10*3/uL (ref 4.0–10.3)

## 2017-03-06 LAB — COMPREHENSIVE METABOLIC PANEL
ALT: 11 U/L (ref 0–55)
ANION GAP: 9 meq/L (ref 3–11)
AST: 15 U/L (ref 5–34)
Albumin: 3.5 g/dL (ref 3.5–5.0)
Alkaline Phosphatase: 73 U/L (ref 40–150)
BILIRUBIN TOTAL: 0.25 mg/dL (ref 0.20–1.20)
BUN: 19.6 mg/dL (ref 7.0–26.0)
CO2: 24 meq/L (ref 22–29)
CREATININE: 1.1 mg/dL (ref 0.7–1.3)
Calcium: 10.2 mg/dL (ref 8.4–10.4)
Chloride: 107 mEq/L (ref 98–109)
EGFR: 81 mL/min/{1.73_m2} — ABNORMAL LOW (ref >90)
GLUCOSE: 243 mg/dL — AB (ref 70–140)
Potassium: 4.1 mEq/L (ref 3.5–5.1)
Sodium: 141 mEq/L (ref 136–145)
TOTAL PROTEIN: 7.4 g/dL (ref 6.4–8.3)

## 2017-03-06 MED ORDER — PALONOSETRON HCL INJECTION 0.25 MG/5ML
INTRAVENOUS | Status: AC
  Filled 2017-03-06: qty 5

## 2017-03-06 MED ORDER — PALONOSETRON HCL INJECTION 0.25 MG/5ML
0.2500 mg | Freq: Once | INTRAVENOUS | Status: AC
Start: 2017-03-06 — End: 2017-03-06
  Administered 2017-03-06: 0.25 mg via INTRAVENOUS

## 2017-03-06 MED ORDER — SODIUM CHLORIDE 0.9% FLUSH
10.0000 mL | INTRAVENOUS | Status: DC | PRN
  Filled 2017-03-06: qty 10

## 2017-03-06 MED ORDER — SODIUM CHLORIDE 0.9 % IV SOLN
Freq: Once | INTRAVENOUS | Status: AC
  Administered 2017-03-06: 15:00:00 via INTRAVENOUS

## 2017-03-06 MED ORDER — SODIUM CHLORIDE 0.9 % IV SOLN
544.5000 mg | Freq: Once | INTRAVENOUS | Status: AC
  Administered 2017-03-06: 540 mg via INTRAVENOUS
  Filled 2017-03-06: qty 54

## 2017-03-06 MED ORDER — DEXAMETHASONE SODIUM PHOSPHATE 10 MG/ML IJ SOLN
INTRAMUSCULAR | Status: AC
  Filled 2017-03-06: qty 1

## 2017-03-06 MED ORDER — HEPARIN SOD (PORK) LOCK FLUSH 100 UNIT/ML IV SOLN
500.0000 [IU] | Freq: Once | INTRAVENOUS | Status: DC | PRN
  Filled 2017-03-06: qty 5

## 2017-03-06 MED ORDER — DEXAMETHASONE SODIUM PHOSPHATE 10 MG/ML IJ SOLN
10.0000 mg | Freq: Once | INTRAMUSCULAR | Status: AC
  Administered 2017-03-06: 10 mg via INTRAVENOUS

## 2017-03-06 MED ORDER — CYANOCOBALAMIN 1000 MCG/ML IJ SOLN
1000.0000 ug | Freq: Once | INTRAMUSCULAR | Status: AC
  Administered 2017-03-06: 1000 ug via INTRAMUSCULAR

## 2017-03-06 MED ORDER — SODIUM CHLORIDE 0.9 % IV SOLN
480.0000 mg/m2 | Freq: Once | INTRAVENOUS | Status: AC
  Administered 2017-03-06: 1000 mg via INTRAVENOUS
  Filled 2017-03-06: qty 40

## 2017-03-06 MED ORDER — CYANOCOBALAMIN 1000 MCG/ML IJ SOLN
INTRAMUSCULAR | Status: AC
  Filled 2017-03-06: qty 1

## 2017-03-06 NOTE — Progress Notes (Signed)
[facility] [phone]   [phone]  OFFICE PROGRESS NOTE  Jim Calamity, MD [place]. [place] [address]  DIAGNOSIS: Stage IV (T3, N3, M1b) non-small cell lung cancer, adenocarcinoma with negative EGFR, ALK, ROS 1 and BRAF mutations as well as negative PDL 1 expression diagnosed in February 2018 and presented with large right upper lobe lung mass with mediastinal and left cervical lymphadenopathy as well as metastatic disease to the adrenal glands and bones.  PRIOR THERAPY: None.  CURRENT THERAPY: Systemic chemotherapy with carboplatin for AUC of 5 and Alimta 500 MG/M2 every 3 weeks. Status post 2 cycles.  INTERVAL HISTORY: Jim [name] 71 y.o. male returns to the clinic today for returns to the clinic today for follow-up visit. The patient is tolerating his treatment with systemic chemotherapy with carboplatin and Alimta fairly well. He denied having any chest pain, shortness breath, cough or hemoptysis. He has no fever or chills. He denied having any nausea, vomiting, diarrhea or constipation. He is here today for evaluation before starting cycle #3 of his treatment.  MEDICAL HISTORY: Past Medical History:  Diagnosis Date  . Adenocarcinoma of right lung, stage 4 ([place]) 01/03/2017  . Alcohol abuse 02/06/2011   with alcohol withdrawal during hospital stay  . Angioedema 02/06/2011   lisinopril  . Cardiomyopathy 02/06/2011   Cardiomyopathy with systolic heart failure, newly diagnosed  . Confusion 02/06/2011   possibly secondary to cerebrovascular accident  versus dementia  . CVA (cerebral infarction) 02/06/2011   Ischemic acute infarct in right anterior cerebral artery territory  . Diabetes mellitus   . Encounter for antineoplastic chemotherapy 01/03/2017  . Goals of care, counseling/discussion 01/03/2017  . Hypertension    Mild LVH per echo  . Kidney disease, chronic, stage III (GFR 30-59 ml/min)   . Left bundle branch block  02/06/2011  . Marijuana abuse 02/06/2011  . Nonischemic cardiomyopathy (HCC)    EF is 30 to 35%  . Periodontal disease   . Periodontal disease    extensive  . Tobacco abuse     ALLERGIES:  is allergic to metformin and related and lisinopril.  MEDICATIONS:  Current Outpatient Prescriptions  Medication Sig Dispense Refill  . albuterol (PROVENTIL HFA;VENTOLIN HFA) 108 (90 Base) MCG/ACT inhaler Inhale 1 puff into the lungs every 6 (six) hours as needed.    Marland Kitchen aspirin 81 MG tablet Take 81 mg by mouth daily.      Marland Kitchen buPROPion (WELLBUTRIN SR) 100 MG 12 hr tablet Take 100 mg by mouth 2 (two) times daily.    . carvedilol (COREG) 12.5 MG tablet Take 1 tablet by mouth daily.    . ciprofloxacin (CIPRO) 500 MG tablet Take 500 mg by mouth 2 (two) times daily.    . clopidogrel (PLAVIX) 75 MG tablet Take 75 mg by mouth daily with breakfast.    . dexamethasone (DECADRON) 4 MG tablet 4 mg by mouth twice a day the day before, day of and day after the chemotherapy every 3 weeks 40 tablet 1  . diphenhydrAMINE (BENADRYL) 25 MG tablet Take 1 tablet (25 mg total) by mouth every 6 (six) hours. (Patient not taking: Reported on 01/03/2017) 20 tablet 0  . famotidine (PEPCID) 20 MG tablet Take 1 tablet (20 mg total) by mouth 2 (two) times daily. (Patient not taking: Reported on 01/21/2017) 10 tablet 0  . folic acid (FOLVITE) 1 MG tablet Take 1 tablet (1 mg total) by mouth daily. 30 tablet  4  . hydrALAZINE (APRESOLINE) 25 MG tablet Take 1 tablet (25 mg total) by mouth 2 (two) times daily. 120 tablet 11  . hydrochlorothiazide (HYDRODIURIL) 25 MG tablet Take 25 mg by mouth daily.    . Insulin Glargine (LANTUS) 100 UNIT/ML Solostar Pen Inject 14 Units into the skin 2 (two) times daily. Inject 28 units daily    . isosorbide mononitrate (IMDUR) 30 MG 24 hr tablet Take 1 tablet (30 mg total) by mouth daily. 30 tablet 11  . isosorbide mononitrate (IMDUR) 30 MG 24 hr tablet TAKE 1 TABLET BY MOUTH DAILY    . multivitamin  (THERAGRAN) per tablet Take 1 tablet by mouth daily.      . nitroGLYCERIN (NITROSTAT) 0.4 MG SL tablet Place 0.4 mg under the tongue every 5 (five) minutes as needed for chest pain.     . phenazopyridine (PYRIDIUM) 200 MG tablet Take 1 tablet (200 mg total) by mouth 3 (three) times daily as needed for pain. 30 tablet 0  . prochlorperazine (COMPAZINE) 10 MG tablet Take 1 tablet (10 mg total) by mouth every 6 (six) hours as needed for nausea or vomiting. 30 tablet 0  . rosuvastatin (CRESTOR) 10 MG tablet Take 10 mg by mouth daily.      . tamsulosin (FLOMAX) 0.4 MG CAPS capsule     . traMADol (ULTRAM) 50 MG tablet Take 50 mg by mouth every 8 (eight) hours as needed for moderate pain.    . traZODone (DESYREL) 50 MG tablet Take 50 mg by mouth at bedtime. Take 1/2 to 1 tablet hs     No current facility-administered medications for this visit.     SURGICAL HISTORY:  Past Surgical History:  Procedure Laterality Date  . US ECHOCARDIOGRAPHY  April 2012   EF 30 to 35 %    REVIEW OF SYSTEMS:  A comprehensive review of systems was negative except for: Musculoskeletal: positive for muscle weakness   PHYSICAL EXAMINATION: General appearance: alert, cooperative, fatigued and no distress Head: Normocephalic, without obvious abnormality, atraumatic Neck: no adenopathy, no JVD, supple, symmetrical, trachea midline and thyroid not enlarged, symmetric, no tenderness/mass/nodules Lymph nodes: Cervical, supraclavicular, and axillary nodes normal. Resp: clear to auscultation bilaterally Back: symmetric, no curvature. ROM normal. No CVA tenderness. Cardio: regular rate and rhythm, S1, S2 normal, no murmur, click, rub or gallop GI: soft, non-tender; bowel sounds normal; no masses,  no organomegaly Extremities: extremities normal, atraumatic, no cyanosis or edema  ECOG PERFORMANCE STATUS: 1 - Symptomatic but completely ambulatory  Blood pressure (!) 149/86, pulse (!) 101, temperature 97.7 F (36.5 C),  temperature source Oral, resp. rate 20, height _0  (1.803 m), weight 184 lb 3.2 oz (83.6 kg), SpO2 100 %.  LABORATORY DATA: Lab Results  Component Value Date   WBC 5.8 03/06/2017   HGB 9.9 (L) 03/06/2017   HCT 30.9 (L) 03/06/2017   MCV 85.7 03/06/2017   PLT 543 (H) 03/06/2017      Chemistry      Component Value Date/Time   NA 142 02/27/2017 0934   K 4.1 02/27/2017 0934   CL 99 11/06/2013 1940   CO2 26 02/27/2017 0934   BUN 14.0 02/27/2017 0934   CREATININE 1.1 02/27/2017 0934      Component Value Date/Time   CALCIUM 9.8 02/27/2017 0934   ALKPHOS 81 02/27/2017 0934   AST 17 02/27/2017 0934   ALT 11 02/27/2017 0934   BILITOT 0.33 02/27/2017 0934       RADIOGRAPHIC STUDIES: No results  found.  ASSESSMENT AND PLAN: This is a very pleasant 71 years old African-American male with a stage IV non-small cell lung cancer, adenocarcinoma with no actionable mutations The patient is currently undergoing systemic chemotherapy with carboplatin and Alimta status post 2 cycles. Has been tolerating his treatment well with no significant adverse effects. I recommended for the patient to proceed with cycle #3 today as a scheduled. I will see him back for follow-up visit in 3 weeks for evaluation before starting cycle #4 and after repeating CT scan of the chest, abdomen and pelvis for restaging of his disease. He was advised to call immediately if he has any concerning symptoms in the interval. The patient voices understanding of current disease status and treatment options and is in agreement with the current care plan. All questions were answered. The patient knows to call the clinic with any problems, questions or concerns. We can certainly see the patient much sooner if necessary. I spent 10 minutes counseling the patient face to face. The total time spent in the appointment was 15 minutes.  Disclaimer: This note was dictated with voice recognition software. Similar sounding words can  inadvertently be transcribed and may not be corrected upon review.

## 2017-03-06 NOTE — Telephone Encounter (Signed)
Gave patient AVS and calender per 5/9 los - [facility] to contact patient with CT schedule

## 2017-03-06 NOTE — Patient Instructions (Signed)
[facility] Discharge Instructions for Patients Receiving Chemotherapy  Today you received the following chemotherapy agents Alimta/Carboplatin  To help prevent nausea and vomiting after your treatment, we encourage you to take your nausea medication as prescribed.    If you develop nausea and vomiting that is not controlled by your nausea medication, call the clinic.   BELOW ARE SYMPTOMS THAT SHOULD BE REPORTED IMMEDIATELY:  *FEVER GREATER THAN 100.5 F  *CHILLS WITH OR WITHOUT FEVER  NAUSEA AND VOMITING THAT IS NOT CONTROLLED WITH YOUR NAUSEA MEDICATION  *UNUSUAL SHORTNESS OF BREATH  *UNUSUAL BRUISING OR BLEEDING  TENDERNESS IN MOUTH AND THROAT WITH OR WITHOUT PRESENCE OF ULCERS  *URINARY PROBLEMS  *BOWEL PROBLEMS  UNUSUAL RASH Items with * indicate a potential emergency and should be followed up as soon as possible.  Feel free to call the clinic you have any questions or concerns. The clinic phone number is [phone].  Please show the CHEMO ALERT CARD at check-in to the Emergency Department and triage nurse.   Pemetrexed injection (Alimta) What is this medicine? PEMETREXED (PEM e TREX ed) is a chemotherapy drug used to treat lung cancers like non-small cell lung cancer and mesothelioma. It may also be used to treat other cancers. This medicine may be used for other purposes; ask your health care provider or pharmacist if you have questions. COMMON BRAND NAME(S): Alimta What should I tell my health care provider before I take this medicine? They need to know if you have any of these conditions: -infection (especially a virus infection such as chickenpox, cold sores, or herpes) -kidney disease -low blood counts, like low white cell, platelet, or red cell counts -lung or breathing disease, like asthma -radiation therapy -an unusual or allergic reaction to pemetrexed, other medicines, foods, dyes, or preservative -pregnant or trying to get  pregnant -breast-feeding How should I use this medicine? This drug is given as an infusion into a vein. It is administered in a hospital or clinic by a specially trained health care professional. Talk to your pediatrician regarding the use of this medicine in children. Special care may be needed. Overdosage: If you think you have taken too much of this medicine contact a poison control center or emergency room at once. NOTE: This medicine is only for you. Do not share this medicine with others. What if I miss a dose? It is important not to miss your dose. Call your doctor or health care professional if you are unable to keep an appointment. What may interact with this medicine? This medicine may interact with the following medications: -Ibuprofen This list may not describe all possible interactions. Give your health care provider a list of all the medicines, herbs, non-prescription drugs, or dietary supplements you use. Also tell them if you smoke, drink alcohol, or use illegal drugs. Some items may interact with your medicine. What should I watch for while using this medicine? Visit your doctor for checks on your progress. This drug may make you feel generally unwell. This is not uncommon, as chemotherapy can affect healthy cells as well as cancer cells. Report any side effects. Continue your course of treatment even though you feel ill unless your doctor tells you to stop. In some cases, you may be given additional medicines to help with side effects. Follow all directions for their use. Call your doctor or health care professional for advice if you get a fever, chills or sore throat, or other symptoms of a cold or flu. Do not   treat yourself. This drug decreases your body's ability to fight infections. Try to avoid being around people who are sick. This medicine may increase your risk to bruise or bleed. Call your doctor or health care professional if you notice any unusual bleeding. Be careful  brushing and flossing your teeth or using a toothpick because you may get an infection or bleed more easily. If you have any dental work done, tell your dentist you are receiving this medicine. Avoid taking products that contain aspirin, acetaminophen, ibuprofen, naproxen, or ketoprofen unless instructed by your doctor. These medicines may hide a fever. Call your doctor or health care professional if you get diarrhea or mouth sores. Do not treat yourself. To protect your kidneys, drink water or other fluids as directed while you are taking this medicine. Do not become pregnant while taking this medicine or for 6 months after stopping it. Women should inform their doctor if they wish to become pregnant or think they might be pregnant. Men should not father a child while taking this medicine and for 3 months after stopping it. This may interfere with the ability to father a child. You should talk to your doctor or health care professional if you are concerned about your fertility. There is a potential for serious side effects to an unborn child. Talk to your health care professional or pharmacist for more information. Do not breast-feed an infant while taking this medicine or for 1 week after stopping it. What side effects may I notice from receiving this medicine? Side effects that you should report to your doctor or health care professional as soon as possible: -allergic reactions like skin rash, itching or hives, swelling of the face, lips, or tongue -breathing problems -redness, blistering, peeling or loosening of the skin, including inside the mouth -signs and symptoms of bleeding such as bloody or black, tarry stools; red or dark-brown urine; spitting up blood or brown material that looks like coffee grounds; red spots on the skin; unusual bruising or bleeding from the eye, gums, or nose -signs and symptoms of infection like fever or chills; cough; sore throat; pain or trouble passing urine -signs  and symptoms of kidney injury like trouble passing urine or change in the amount of urine -signs and symptoms of liver injury like dark yellow or brown urine; general ill feeling or flu-like symptoms; light-colored stools; loss of appetite; nausea; right upper belly pain; unusually weak or tired; yellowing of the eyes or skin Side effects that usually do not require medical attention (report to your doctor or health care professional if they continue or are bothersome): -constipation -dizziness -mouth sores -nausea, vomiting -pain, tingling, numbness in the hands or feet -unusually weak or tired This list may not describe all possible side effects. Call your doctor for medical advice about side effects. You may report side effects to FDA at 1-800-FDA-1088. Where should I keep my medicine? This drug is given in a hospital or clinic and will not be stored at home. NOTE: This sheet is a summary. It may not cover all possible information. If you have questions about this medicine, talk to your doctor, pharmacist, or health care provider.  2018 [employer] Standard (2016-08-14 18:51:46)   Carboplatin injection What is this medicine? CARBOPLATIN (KAR boe pla tin) is a chemotherapy drug. It targets fast dividing cells, like cancer cells, and causes these cells to die. This medicine is used to treat ovarian cancer and many other cancers. This medicine may be used for other purposes; ask   your health care provider or pharmacist if you have questions. COMMON BRAND NAME(S): Paraplatin What should I tell my health care provider before I take this medicine? They need to know if you have any of these conditions: -blood disorders -hearing problems -kidney disease -recent or ongoing radiation therapy -an unusual or allergic reaction to carboplatin, cisplatin, other chemotherapy, other medicines, foods, dyes, or preservatives -pregnant or trying to get pregnant -breast-feeding How should I use this  medicine? This drug is usually given as an infusion into a vein. It is administered in a hospital or clinic by a specially trained health care professional. Talk to your pediatrician regarding the use of this medicine in children. Special care may be needed. Overdosage: If you think you have taken too much of this medicine contact a poison control center or emergency room at once. NOTE: This medicine is only for you. Do not share this medicine with others. What if I miss a dose? It is important not to miss a dose. Call your doctor or health care professional if you are unable to keep an appointment. What may interact with this medicine? -medicines for seizures -medicines to increase blood counts like filgrastim, pegfilgrastim, sargramostim -some antibiotics like amikacin, gentamicin, neomycin, streptomycin, tobramycin -vaccines Talk to your doctor or health care professional before taking any of these medicines: -acetaminophen -aspirin -ibuprofen -ketoprofen -naproxen This list may not describe all possible interactions. Give your health care provider a list of all the medicines, herbs, non-prescription drugs, or dietary supplements you use. Also tell them if you smoke, drink alcohol, or use illegal drugs. Some items may interact with your medicine. What should I watch for while using this medicine? Your condition will be monitored carefully while you are receiving this medicine. You will need important blood work done while you are taking this medicine. This drug may make you feel generally unwell. This is not uncommon, as chemotherapy can affect healthy cells as well as cancer cells. Report any side effects. Continue your course of treatment even though you feel ill unless your doctor tells you to stop. In some cases, you may be given additional medicines to help with side effects. Follow all directions for their use. Call your doctor or health care professional for advice if you get a  fever, chills or sore throat, or other symptoms of a cold or flu. Do not treat yourself. This drug decreases your body's ability to fight infections. Try to avoid being around people who are sick. This medicine may increase your risk to bruise or bleed. Call your doctor or health care professional if you notice any unusual bleeding. Be careful brushing and flossing your teeth or using a toothpick because you may get an infection or bleed more easily. If you have any dental work done, tell your dentist you are receiving this medicine. Avoid taking products that contain aspirin, acetaminophen, ibuprofen, naproxen, or ketoprofen unless instructed by your doctor. These medicines may hide a fever. Do not become pregnant while taking this medicine. Women should inform their doctor if they wish to become pregnant or think they might be pregnant. There is a potential for serious side effects to an unborn child. Talk to your health care professional or pharmacist for more information. Do not breast-feed an infant while taking this medicine. What side effects may I notice from receiving this medicine? Side effects that you should report to your doctor or health care professional as soon as possible: -allergic reactions like skin rash, itching or hives, swelling   of the face, lips, or tongue -signs of infection - fever or chills, cough, sore throat, pain or difficulty passing urine -signs of decreased platelets or bleeding - bruising, pinpoint red spots on the skin, black, tarry stools, nosebleeds -signs of decreased red blood cells - unusually weak or tired, fainting spells, lightheadedness -breathing problems -changes in hearing -changes in vision -chest pain -high blood pressure -low blood counts - This drug may decrease the number of white blood cells, red blood cells and platelets. You may be at increased risk for infections and bleeding. -nausea and vomiting -pain, swelling, redness or irritation at the  injection site -pain, tingling, numbness in the hands or feet -problems with balance, talking, walking -trouble passing urine or change in the amount of urine Side effects that usually do not require medical attention (report to your doctor or health care professional if they continue or are bothersome): -hair loss -loss of appetite -metallic taste in the mouth or changes in taste This list may not describe all possible side effects. Call your doctor for medical advice about side effects. You may report side effects to FDA at 1-800-FDA-1088. Where should I keep my medicine? This drug is given in a hospital or clinic and will not be stored at home. NOTE: This sheet is a summary. It may not cover all possible information. If you have questions about this medicine, talk to your doctor, pharmacist, or health care provider.  2018 [employer]/Gold Standard (2008-01-20 14:38:05)  

## 2017-03-13 ENCOUNTER — Other Ambulatory Visit (HOSPITAL_BASED_OUTPATIENT_CLINIC_OR_DEPARTMENT_OTHER): Payer: Medicare HMO

## 2017-03-13 DIAGNOSIS — C3491 Malignant neoplasm of unspecified part of right bronchus or lung: Secondary | ICD-10-CM | POA: Diagnosis not present

## 2017-03-13 LAB — CBC WITH DIFFERENTIAL/PLATELET
BASO%: 2 % (ref 0.0–2.0)
BASOS ABS: 0.1 10*3/uL (ref 0.0–0.1)
EOS ABS: 0.2 10*3/uL (ref 0.0–0.5)
EOS%: 6.1 % (ref 0.0–7.0)
HCT: 28.6 % — ABNORMAL LOW (ref 38.4–49.9)
HGB: 9.4 g/dL — ABNORMAL LOW (ref 13.0–17.1)
LYMPH%: 34.9 % (ref 14.0–49.0)
MCH: 27.9 pg (ref 27.2–33.4)
MCHC: 32.7 g/dL (ref 32.0–36.0)
MCV: 85.4 fL (ref 79.3–98.0)
MONO#: 0.3 10*3/uL (ref 0.1–0.9)
MONO%: 11.1 % (ref 0.0–14.0)
NEUT#: 1.3 10*3/uL — ABNORMAL LOW (ref 1.5–6.5)
NEUT%: 45.9 % (ref 39.0–75.0)
Platelets: 296 10*3/uL (ref 140–400)
RBC: 3.36 10*6/uL — AB (ref 4.20–5.82)
RDW: 17.7 % — ABNORMAL HIGH (ref 11.0–14.6)
WBC: 2.7 10*3/uL — ABNORMAL LOW (ref 4.0–10.3)
lymph#: 1 10*3/uL (ref 0.9–3.3)

## 2017-03-13 LAB — COMPREHENSIVE METABOLIC PANEL
ALT: 10 U/L (ref 0–55)
ANION GAP: 8 meq/L (ref 3–11)
AST: 17 U/L (ref 5–34)
Albumin: 3.2 g/dL — ABNORMAL LOW (ref 3.5–5.0)
Alkaline Phosphatase: 61 U/L (ref 40–150)
BUN: 17.9 mg/dL (ref 7.0–26.0)
CALCIUM: 9.9 mg/dL (ref 8.4–10.4)
CHLORIDE: 105 meq/L (ref 98–109)
CO2: 27 mEq/L (ref 22–29)
CREATININE: 1.1 mg/dL (ref 0.7–1.3)
EGFR: 82 mL/min/{1.73_m2} — AB (ref >90)
Glucose: 162 mg/dl — ABNORMAL HIGH (ref 70–140)
POTASSIUM: 3.9 meq/L (ref 3.5–5.1)
Sodium: 140 mEq/L (ref 136–145)
Total Bilirubin: 0.52 mg/dL (ref 0.20–1.20)
Total Protein: 6.5 g/dL (ref 6.4–8.3)

## 2017-03-20 ENCOUNTER — Ambulatory Visit: Payer: Medicare HMO | Admitting: Internal Medicine

## 2017-03-20 ENCOUNTER — Ambulatory Visit: Payer: Medicare HMO

## 2017-03-20 ENCOUNTER — Other Ambulatory Visit (HOSPITAL_BASED_OUTPATIENT_CLINIC_OR_DEPARTMENT_OTHER): Payer: Medicare HMO

## 2017-03-20 DIAGNOSIS — C3491 Malignant neoplasm of unspecified part of right bronchus or lung: Secondary | ICD-10-CM | POA: Diagnosis not present

## 2017-03-20 LAB — CBC WITH DIFFERENTIAL/PLATELET
BASO%: 0.6 % (ref 0.0–2.0)
Basophils Absolute: 0 10*3/uL (ref 0.0–0.1)
EOS%: 4.2 % (ref 0.0–7.0)
Eosinophils Absolute: 0.2 10*3/uL (ref 0.0–0.5)
HCT: 30.2 % — ABNORMAL LOW (ref 38.4–49.9)
HGB: 9.2 g/dL — ABNORMAL LOW (ref 13.0–17.1)
LYMPH%: 22.6 % (ref 14.0–49.0)
MCH: 26.7 pg — ABNORMAL LOW (ref 27.2–33.4)
MCHC: 30.5 g/dL — AB (ref 32.0–36.0)
MCV: 87.8 fL (ref 79.3–98.0)
MONO#: 0.5 10*3/uL (ref 0.1–0.9)
MONO%: 10.6 % (ref 0.0–14.0)
NEUT%: 62 % (ref 39.0–75.0)
NEUTROS ABS: 3.1 10*3/uL (ref 1.5–6.5)
PLATELETS: 110 10*3/uL — AB (ref 140–400)
RBC: 3.44 10*6/uL — AB (ref 4.20–5.82)
RDW: 16.4 % — ABNORMAL HIGH (ref 11.0–14.6)
WBC: 5 10*3/uL (ref 4.0–10.3)
lymph#: 1.1 10*3/uL (ref 0.9–3.3)

## 2017-03-20 LAB — COMPREHENSIVE METABOLIC PANEL
ALT: 9 U/L (ref 0–55)
AST: 14 U/L (ref 5–34)
Albumin: 3.3 g/dL — ABNORMAL LOW (ref 3.5–5.0)
Alkaline Phosphatase: 70 U/L (ref 40–150)
Anion Gap: 9 mEq/L (ref 3–11)
BILIRUBIN TOTAL: 0.36 mg/dL (ref 0.20–1.20)
BUN: 16.5 mg/dL (ref 7.0–26.0)
CHLORIDE: 109 meq/L (ref 98–109)
CO2: 25 meq/L (ref 22–29)
Calcium: 9.5 mg/dL (ref 8.4–10.4)
Creatinine: 1 mg/dL (ref 0.7–1.3)
EGFR: 84 mL/min/{1.73_m2} — ABNORMAL LOW (ref >90)
GLUCOSE: 102 mg/dL (ref 70–140)
POTASSIUM: 3.7 meq/L (ref 3.5–5.1)
SODIUM: 143 meq/L (ref 136–145)
TOTAL PROTEIN: 6.8 g/dL (ref 6.4–8.3)

## 2017-03-22 ENCOUNTER — Ambulatory Visit (HOSPITAL_COMMUNITY): Payer: Medicare HMO

## 2017-03-26 ENCOUNTER — Ambulatory Visit (HOSPITAL_COMMUNITY): Payer: Medicare HMO

## 2017-03-27 ENCOUNTER — Other Ambulatory Visit (HOSPITAL_BASED_OUTPATIENT_CLINIC_OR_DEPARTMENT_OTHER): Payer: Medicare HMO

## 2017-03-27 ENCOUNTER — Ambulatory Visit (HOSPITAL_BASED_OUTPATIENT_CLINIC_OR_DEPARTMENT_OTHER): Payer: Medicare HMO

## 2017-03-27 ENCOUNTER — Ambulatory Visit (HOSPITAL_BASED_OUTPATIENT_CLINIC_OR_DEPARTMENT_OTHER): Payer: Medicare HMO | Admitting: Internal Medicine

## 2017-03-27 ENCOUNTER — Encounter (HOSPITAL_COMMUNITY): Payer: Self-pay

## 2017-03-27 ENCOUNTER — Ambulatory Visit (HOSPITAL_COMMUNITY)
Admission: RE | Admit: 2017-03-27 | Discharge: 2017-03-27 | Disposition: A | Discharge status: Home / Self Care | Payer: Medicare HMO | Source: Ambulatory Visit | Attending: Internal Medicine | Admitting: Internal Medicine

## 2017-03-27 VITALS — BP 125/75 | HR 80 | Temp 97.6°F | Resp 18 | Ht 71.0 in | Wt 184.2 lb

## 2017-03-27 DIAGNOSIS — N183 Chronic kidney disease, stage 3 unspecified: Secondary | ICD-10-CM

## 2017-03-27 DIAGNOSIS — R590 Localized enlarged lymph nodes: Secondary | ICD-10-CM | POA: Diagnosis not present

## 2017-03-27 DIAGNOSIS — C3491 Malignant neoplasm of unspecified part of right bronchus or lung: Secondary | ICD-10-CM | POA: Diagnosis present

## 2017-03-27 DIAGNOSIS — C7972 Secondary malignant neoplasm of left adrenal gland: Secondary | ICD-10-CM

## 2017-03-27 DIAGNOSIS — R918 Other nonspecific abnormal finding of lung field: Secondary | ICD-10-CM | POA: Insufficient documentation

## 2017-03-27 DIAGNOSIS — C7951 Secondary malignant neoplasm of bone: Secondary | ICD-10-CM

## 2017-03-27 DIAGNOSIS — C3411 Malignant neoplasm of upper lobe, right bronchus or lung: Secondary | ICD-10-CM | POA: Diagnosis not present

## 2017-03-27 DIAGNOSIS — C7971 Secondary malignant neoplasm of right adrenal gland: Secondary | ICD-10-CM

## 2017-03-27 DIAGNOSIS — M8448XA Pathological fracture, other site, initial encounter for fracture: Secondary | ICD-10-CM | POA: Diagnosis not present

## 2017-03-27 DIAGNOSIS — Z5111 Encounter for antineoplastic chemotherapy: Secondary | ICD-10-CM | POA: Diagnosis not present

## 2017-03-27 DIAGNOSIS — I10 Essential (primary) hypertension: Secondary | ICD-10-CM | POA: Insufficient documentation

## 2017-03-27 DIAGNOSIS — I2510 Atherosclerotic heart disease of native coronary artery without angina pectoris: Secondary | ICD-10-CM | POA: Diagnosis not present

## 2017-03-27 DIAGNOSIS — I700 Atherosclerosis of aorta: Secondary | ICD-10-CM | POA: Diagnosis not present

## 2017-03-27 DIAGNOSIS — E279 Disorder of adrenal gland, unspecified: Secondary | ICD-10-CM | POA: Insufficient documentation

## 2017-03-27 LAB — CBC WITH DIFFERENTIAL/PLATELET
BASO%: 0.6 % (ref 0.0–2.0)
BASOS ABS: 0 10*3/uL (ref 0.0–0.1)
EOS ABS: 0.3 10*3/uL (ref 0.0–0.5)
EOS%: 6.9 % (ref 0.0–7.0)
HEMATOCRIT: 32.9 % — AB (ref 38.4–49.9)
HGB: 10 g/dL — ABNORMAL LOW (ref 13.0–17.1)
LYMPH#: 1.4 10*3/uL (ref 0.9–3.3)
LYMPH%: 28.2 % (ref 14.0–49.0)
MCH: 26.7 pg — ABNORMAL LOW (ref 27.2–33.4)
MCHC: 30.4 g/dL — AB (ref 32.0–36.0)
MCV: 87.7 fL (ref 79.3–98.0)
MONO#: 0.7 10*3/uL (ref 0.1–0.9)
MONO%: 13.3 % (ref 0.0–14.0)
NEUT#: 2.5 10*3/uL (ref 1.5–6.5)
NEUT%: 51 % (ref 39.0–75.0)
PLATELETS: 349 10*3/uL (ref 140–400)
RBC: 3.75 10*6/uL — AB (ref 4.20–5.82)
RDW: 17.5 % — ABNORMAL HIGH (ref 11.0–14.6)
WBC: 4.9 10*3/uL (ref 4.0–10.3)

## 2017-03-27 LAB — COMPREHENSIVE METABOLIC PANEL
ALT: 8 U/L (ref 0–55)
ANION GAP: 7 meq/L (ref 3–11)
AST: 13 U/L (ref 5–34)
Albumin: 3.4 g/dL — ABNORMAL LOW (ref 3.5–5.0)
Alkaline Phosphatase: 68 U/L (ref 40–150)
BILIRUBIN TOTAL: 0.48 mg/dL (ref 0.20–1.20)
BUN: 12 mg/dL (ref 7.0–26.0)
CALCIUM: 9.8 mg/dL (ref 8.4–10.4)
CHLORIDE: 104 meq/L (ref 98–109)
CO2: 24 meq/L (ref 22–29)
Creatinine: 0.9 mg/dL (ref 0.7–1.3)
Glucose: 83 mg/dl (ref 70–140)
Potassium: 3.6 mEq/L (ref 3.5–5.1)
Sodium: 136 mEq/L (ref 136–145)
Total Protein: 7.1 g/dL (ref 6.4–8.3)

## 2017-03-27 MED ORDER — SODIUM CHLORIDE 0.9 % IV SOLN
544.5000 mg | Freq: Once | INTRAVENOUS | Status: AC
  Administered 2017-03-27: 540 mg via INTRAVENOUS
  Filled 2017-03-27: qty 54

## 2017-03-27 MED ORDER — PALONOSETRON HCL INJECTION 0.25 MG/5ML
0.2500 mg | Freq: Once | INTRAVENOUS | Status: AC
  Administered 2017-03-27: 0.25 mg via INTRAVENOUS

## 2017-03-27 MED ORDER — IOPAMIDOL (ISOVUE-300) INJECTION 61%
INTRAVENOUS | Status: AC
  Filled 2017-03-27: qty 100

## 2017-03-27 MED ORDER — IOPAMIDOL (ISOVUE-300) INJECTION 61%
100.0000 mL | Freq: Once | INTRAVENOUS | Status: AC | PRN
  Administered 2017-03-27: 100 mL via INTRAVENOUS

## 2017-03-27 MED ORDER — PALONOSETRON HCL INJECTION 0.25 MG/5ML
INTRAVENOUS | Status: AC
  Filled 2017-03-27: qty 5

## 2017-03-27 MED ORDER — DEXAMETHASONE SODIUM PHOSPHATE 10 MG/ML IJ SOLN
10.0000 mg | Freq: Once | INTRAMUSCULAR | Status: AC
  Administered 2017-03-27: 10 mg via INTRAVENOUS

## 2017-03-27 MED ORDER — SODIUM CHLORIDE 0.9 % IV SOLN
475.0000 mg/m2 | Freq: Once | INTRAVENOUS | Status: AC
  Administered 2017-03-27: 1000 mg via INTRAVENOUS
  Filled 2017-03-27: qty 40

## 2017-03-27 MED ORDER — DEXAMETHASONE SODIUM PHOSPHATE 10 MG/ML IJ SOLN
INTRAMUSCULAR | Status: AC
  Filled 2017-03-27: qty 1

## 2017-03-27 MED ORDER — SODIUM CHLORIDE 0.9 % IV SOLN
Freq: Once | INTRAVENOUS | Status: AC
  Administered 2017-03-27: 12:00:00 via INTRAVENOUS

## 2017-03-27 NOTE — Progress Notes (Signed)
[place] [phone]   [phone]  OFFICE PROGRESS NOTE  [name], MD [address] [place] [address]  DIAGNOSIS: Stage IV (T3, N3, M1b) non-small cell lung cancer, adenocarcinoma with negative EGFR, ALK, ROS 1 and BRAF mutations as well as negative PDL 1 expression diagnosed in February 2018 and presented with large right upper lobe lung mass with mediastinal and left cervical lymphadenopathy as well as metastatic disease to the adrenal glands and bones.  PRIOR THERAPY: None.  CURRENT THERAPY: Systemic chemotherapy with carboplatin for AUC of 5 and Alimta 500 MG/M2 every 3 weeks. Status post 3 cycles.  INTERVAL HISTORY: Jim [name] 71 y.o. male returns to the clinic today for follow-up visit. The patient is feeling fine today with no specific complaints except for fatigue. He has been tolerating his systemic chemotherapy was carboplatin and Alimta fairly well. He denied having any chest pain but continues to have shortness breath with exertion with mild cough and no hemoptysis. He has no significant weight loss or night sweats. He has no nausea, vomiting, diarrhea or constipation. He has no fever or chills. He had repeat CT scan of the chest, abdomen and pelvis performed recently and he is here for evaluation and discussion of his scan results.  MEDICAL HISTORY: Past Medical History:  Diagnosis Date  . Adenocarcinoma of right lung, stage 4 ([place]) 01/03/2017  . Alcohol abuse 02/06/2011   with alcohol withdrawal during hospital stay  . Angioedema 02/06/2011   lisinopril  . Cardiomyopathy 02/06/2011   Cardiomyopathy with systolic heart failure, newly diagnosed  . Confusion 02/06/2011   possibly secondary to cerebrovascular accident  versus dementia  . CVA (cerebral infarction) 02/06/2011   Ischemic acute infarct in right anterior cerebral artery territory  . Diabetes mellitus   . Encounter for antineoplastic  chemotherapy 01/03/2017  . Goals of care, counseling/discussion 01/03/2017  . Hypertension    Mild LVH per echo  . Kidney disease, chronic, stage III (GFR 30-59 ml/min)   . Left bundle branch block 02/06/2011  . Marijuana abuse 02/06/2011  . Nonischemic cardiomyopathy (H[facility])    EF is 30 to 35%  . Periodontal disease   . Periodontal disease    extensive  . Tobacco abuse     ALLERGIES:  is allergic to metformin and related and lisinopril.  MEDICATIONS:  Current Outpatient Prescriptions  Medication Sig Dispense Refill  . albuterol (PROVENTIL HFA;VENTOLIN HFA) 108 (90 Base) MCG/ACT inhaler Inhale 1 puff into the lungs every 6 (six) hours as needed.    Marland Kitchen aspirin 81 MG tablet Take 81 mg by mouth daily.      Marland Kitchen buPROPion (WELLBUTRIN SR) 100 MG 12 hr tablet Take 100 mg by mouth 2 (two) times daily.    . carvedilol (COREG) 12.5 MG tablet Take 1 tablet by mouth daily.    . ciprofloxacin (CIPRO) 500 MG tablet Take 500 mg by mouth 2 (two) times daily.    . clopidogrel (PLAVIX) 75 MG tablet Take 75 mg by mouth daily with breakfast.    . dexamethasone (DECADRON) 4 MG tablet 4 mg by mouth twice a day the day before, day of and day after the chemotherapy every 3 weeks 40 tablet 1  . diphenhydrAMINE (BENADRYL) 25 MG tablet Take 1 tablet (25 mg total) by mouth every 6 (six) hours. (Patient not taking: Reported on 01/03/2017) 20 tablet 0  . famotidine (PEPCID) 20 MG tablet Take 1 tablet (20 mg  total) by mouth 2 (two) times daily. (Patient not taking: Reported on 01/21/2017) 10 tablet 0  . folic acid (FOLVITE) 1 MG tablet Take 1 tablet (1 mg total) by mouth daily. 30 tablet 4  . hydrALAZINE (APRESOLINE) 25 MG tablet Take 1 tablet (25 mg total) by mouth 2 (two) times daily. 120 tablet 11  . hydrochlorothiazide (HYDRODIURIL) 25 MG tablet Take 25 mg by mouth daily.    . Insulin Glargine (LANTUS) 100 UNIT/ML Solostar Pen Inject 14 Units into the skin 2 (two) times daily. Inject 28 units daily    . isosorbide  mononitrate (IMDUR) 30 MG 24 hr tablet Take 1 tablet (30 mg total) by mouth daily. 30 tablet 11  . isosorbide mononitrate (IMDUR) 30 MG 24 hr tablet TAKE 1 TABLET BY MOUTH DAILY    . multivitamin (THERAGRAN) per tablet Take 1 tablet by mouth daily.      . nitroGLYCERIN (NITROSTAT) 0.4 MG SL tablet Place 0.4 mg under the tongue every 5 (five) minutes as needed for chest pain.     . phenazopyridine (PYRIDIUM) 200 MG tablet Take 1 tablet (200 mg total) by mouth 3 (three) times daily as needed for pain. 30 tablet 0  . prochlorperazine (COMPAZINE) 10 MG tablet Take 1 tablet (10 mg total) by mouth every 6 (six) hours as needed for nausea or vomiting. 30 tablet 0  . rosuvastatin (CRESTOR) 10 MG tablet Take 10 mg by mouth daily.      . tamsulosin (FLOMAX) 0.4 MG CAPS capsule     . traMADol (ULTRAM) 50 MG tablet Take 50 mg by mouth every 8 (eight) hours as needed for moderate pain.    . traZODone (DESYREL) 50 MG tablet Take 50 mg by mouth at bedtime. Take 1/2 to 1 tablet hs     No current facility-administered medications for this visit.    Facility-Administered Medications Ordered in Other Visits  Medication Dose Route Frequency Provider Last Rate Last Dose  . iopamidol (ISOVUE-300) 61 % injection             SURGICAL HISTORY:  Past Surgical History:  Procedure Laterality Date  . US ECHOCARDIOGRAPHY  April 2012   EF 30 to 35 %    REVIEW OF SYSTEMS:  Constitutional: positive for fatigue Eyes: negative Ears, nose, mouth, throat, and face: negative Respiratory: positive for dyspnea on exertion Cardiovascular: negative Gastrointestinal: negative Genitourinary:negative Integument/breast: negative Hematologic/lymphatic: negative Musculoskeletal:positive for muscle weakness Neurological: negative Behavioral/Psych: negative Endocrine: negative Allergic/Immunologic: negative   PHYSICAL EXAMINATION: General appearance: alert, cooperative, fatigued and no distress Head: Normocephalic, without  obvious abnormality, atraumatic Neck: no adenopathy, no JVD, supple, symmetrical, trachea midline and thyroid not enlarged, symmetric, no tenderness/mass/nodules Lymph nodes: Cervical, supraclavicular, and axillary nodes normal. Resp: clear to auscultation bilaterally Back: symmetric, no curvature. ROM normal. No CVA tenderness. Cardio: regular rate and rhythm, S1, S2 normal, no murmur, click, rub or gallop GI: soft, non-tender; bowel sounds normal; no masses,  no organomegaly Extremities: extremities normal, atraumatic, no cyanosis or edema Neurologic: Alert and oriented X 3, normal strength and tone. Normal symmetric reflexes. Normal coordination and gait  ECOG PERFORMANCE STATUS: 1 - Symptomatic but completely ambulatory  Blood pressure 125/75, pulse 80, temperature 97.6 F (36.4 C), temperature source Oral, resp. rate 18, height 5' 11"  (1.803 m), weight 184 lb 3.2 oz (83.6 kg), SpO2 100 %.  LABORATORY DATA: Lab Results  Component Value Date   WBC 4.9 03/27/2017   HGB 10.0 (L) 03/27/2017   HCT 32.9 (L)  03/27/2017   MCV 87.7 03/27/2017   PLT 349 03/27/2017      Chemistry      Component Value Date/Time   NA 136 03/27/2017 1032   K 3.6 03/27/2017 1032   CL 99 11/06/2013 1940   CO2 24 03/27/2017 1032   BUN 12.0 03/27/2017 1032   CREATININE 0.9 03/27/2017 1032      Component Value Date/Time   CALCIUM 9.8 03/27/2017 1032   ALKPHOS 68 03/27/2017 1032   AST 13 03/27/2017 1032   ALT 8 03/27/2017 1032   BILITOT 0.48 03/27/2017 1032       RADIOGRAPHIC STUDIES: Ct Chest W Contrast  Result Date: 03/27/2017 CLINICAL DATA:  Right lung cancer. EXAM: CT CHEST, ABDOMEN, AND PELVIS WITH CONTRAST TECHNIQUE: Multidetector CT imaging of the chest, abdomen and pelvis was performed following the standard protocol during bolus administration of intravenous contrast. CONTRAST:  125m ISOVUE-300 IOPAMIDOL (ISOVUE-300) INJECTION 61% COMPARISON:  01/17/2017 FINDINGS: CT CHEST FINDINGS  Cardiovascular: The heart size is normal. No pericardial effusion. Coronary artery calcification is noted. Mediastinum/Nodes: Mediastinal lymphadenopathy is slightly improved in the interval. Low left paratracheal lymph node measured previously at 1.8 cm short axis now measures 1.4 cm (image 23 series 2). 1.5 cm high right paratracheal lymph node measured previously at 1.5 cm short axis now measures 1.0 cm. Abnormal soft tissue attenuation again noted in the right hilum. The esophagus has normal imaging features. There is no axillary lymphadenopathy. Lungs/Pleura: The central consolidative change seen in the right upper, middle, and lower lobes has decreased in the interval. As on the prior study, no definite discrete lung lesion is evident, but the area of hypoenhancement is again identified but measures smaller today at 4.6 x 3.1 cm compared to 6.1 x 4.2 cm previously. The scattered bilateral pulmonary nodules seen on the previous study have also decreased in the interval. 8 mm right middle lobe nodule (image 61 series 6) has decreased from 14 mm previously (remeasured). The 13 mm posterior left lower lobe nodule measured on the prior study now measures 8 mm. No new or progressive pulmonary nodule or mass. Small to moderate right pleural effusion is progressive since prior study. Scattered pleural calcification noted left hemithorax. Musculoskeletal: Heterogeneous bony mineralization is stable in the interval without a discrete sclerotic or lytic osseous abnormality. CT ABDOMEN PELVIS FINDINGS Hepatobiliary: No focal abnormality within the liver parenchyma. There is no evidence for gallstones, gallbladder wall thickening, or pericholecystic fluid. No intrahepatic or extrahepatic biliary dilation. Pancreas: No focal mass lesion. No dilatation of the main duct. No intraparenchymal cyst. No peripancreatic edema. Spleen: No splenomegaly. No focal mass lesion. Adrenals/Urinary Tract: Bilateral irregular adrenal lesions  again identified, measuring 2.6 x 1.7 cm on the right (compared to 3.0 x 1.8 cm previously and 4.4 x 1.4 cm on the left (compare to 4.1 x 1.7 cm previously. 3 mm nonobstructing stone identified upper pole right kidney. Vascular calcification noted upper pole left kidney. Stable appearance 4.7 cm upper pole left renal cyst. Small cyst upper pole right kidney is unchanged. No evidence for hydroureter. The urinary bladder appears normal for the degree of distention. Stomach/Bowel: Stomach is nondistended. No gastric wall thickening. No evidence of outlet obstruction. Duodenum is normally positioned as is the ligament of Treitz. No small bowel wall thickening. No small bowel dilatation. Stomach is nondistended. No gastric wall thickening. No evidence of outlet obstruction. The appendix is not visualized, but there is no edema or inflammation in the region of the cecum. Diverticular changes  are noted in the left colon without evidence of diverticulitis. Vascular/Lymphatic: There is abdominal aortic atherosclerosis without aneurysm. There is no gastrohepatic or hepatoduodenal ligament lymphadenopathy. No intraperitoneal or retroperitoneal lymphadenopathy. No pelvic sidewall lymphadenopathy. Reproductive: Prostate gland is enlarged. Other: No intraperitoneal free fluid. Musculoskeletal: Diffuse heterogeneous mineralization again noted. There is superior endplate fracture at L3 with associated focal lucency in the vertebral body best seen on image 89 of sagittal series 5. Patient is also noted to have pathologic fracture of the left L3 transverse process (image 75 series 2) new since prior study. IMPRESSION: 1. Interval decrease in the large confluent masslike area of consolidation involving the central right lung. Multiple irregular bilateral pulmonary nodules are also decreased in size as is the mediastinal lymphadenopathy. 2. Persistent diffuse heterogeneous mineralization of bony anatomy with interval development of  superior endplate compression deformity at L3 associated with a central lucency in the L3 vertebral body. This is associated with apparent pathologic fracture of the left L3 transverse process. Overall imaging features remain concerning for diffuse, widespread bony metastatic involvement. 3. Stable to slight decrease and bilateral irregular adrenal lesions, concerning for metastatic involvement. 4. Thoracoabdominal aortic and coronary atherosclerosis. Electronically Signed   By: Misty Stanley M.D.   On: 03/27/2017 13:01   Ct Abdomen Pelvis W Contrast  Result Date: 03/27/2017 CLINICAL DATA:  Right lung cancer. EXAM: CT CHEST, ABDOMEN, AND PELVIS WITH CONTRAST TECHNIQUE: Multidetector CT imaging of the chest, abdomen and pelvis was performed following the standard protocol during bolus administration of intravenous contrast. CONTRAST:  117m ISOVUE-300 IOPAMIDOL (ISOVUE-300) INJECTION 61% COMPARISON:  01/17/2017 FINDINGS: CT CHEST FINDINGS Cardiovascular: The heart size is normal. No pericardial effusion. Coronary artery calcification is noted. Mediastinum/Nodes: Mediastinal lymphadenopathy is slightly improved in the interval. Low left paratracheal lymph node measured previously at 1.8 cm short axis now measures 1.4 cm (image 23 series 2). 1.5 cm high right paratracheal lymph node measured previously at 1.5 cm short axis now measures 1.0 cm. Abnormal soft tissue attenuation again noted in the right hilum. The esophagus has normal imaging features. There is no axillary lymphadenopathy. Lungs/Pleura: The central consolidative change seen in the right upper, middle, and lower lobes has decreased in the interval. As on the prior study, no definite discrete lung lesion is evident, but the area of hypoenhancement is again identified but measures smaller today at 4.6 x 3.1 cm compared to 6.1 x 4.2 cm previously. The scattered bilateral pulmonary nodules seen on the previous study have also decreased in the interval. 8  mm right middle lobe nodule (image 61 series 6) has decreased from 14 mm previously (remeasured). The 13 mm posterior left lower lobe nodule measured on the prior study now measures 8 mm. No new or progressive pulmonary nodule or mass. Small to moderate right pleural effusion is progressive since prior study. Scattered pleural calcification noted left hemithorax. Musculoskeletal: Heterogeneous bony mineralization is stable in the interval without a discrete sclerotic or lytic osseous abnormality. CT ABDOMEN PELVIS FINDINGS Hepatobiliary: No focal abnormality within the liver parenchyma. There is no evidence for gallstones, gallbladder wall thickening, or pericholecystic fluid. No intrahepatic or extrahepatic biliary dilation. Pancreas: No focal mass lesion. No dilatation of the main duct. No intraparenchymal cyst. No peripancreatic edema. Spleen: No splenomegaly. No focal mass lesion. Adrenals/Urinary Tract: Bilateral irregular adrenal lesions again identified, measuring 2.6 x 1.7 cm on the right (compared to 3.0 x 1.8 cm previously and 4.4 x 1.4 cm on the left (compare to 4.1 x 1.7 cm previously.  3 mm nonobstructing stone identified upper pole right kidney. Vascular calcification noted upper pole left kidney. Stable appearance 4.7 cm upper pole left renal cyst. Small cyst upper pole right kidney is unchanged. No evidence for hydroureter. The urinary bladder appears normal for the degree of distention. Stomach/Bowel: Stomach is nondistended. No gastric wall thickening. No evidence of outlet obstruction. Duodenum is normally positioned as is the ligament of Treitz. No small bowel wall thickening. No small bowel dilatation. Stomach is nondistended. No gastric wall thickening. No evidence of outlet obstruction. The appendix is not visualized, but there is no edema or inflammation in the region of the cecum. Diverticular changes are noted in the left colon without evidence of diverticulitis. Vascular/Lymphatic: There is  abdominal aortic atherosclerosis without aneurysm. There is no gastrohepatic or hepatoduodenal ligament lymphadenopathy. No intraperitoneal or retroperitoneal lymphadenopathy. No pelvic sidewall lymphadenopathy. Reproductive: Prostate gland is enlarged. Other: No intraperitoneal free fluid. Musculoskeletal: Diffuse heterogeneous mineralization again noted. There is superior endplate fracture at L3 with associated focal lucency in the vertebral body best seen on image 89 of sagittal series 5. Patient is also noted to have pathologic fracture of the left L3 transverse process (image 75 series 2) new since prior study. IMPRESSION: 1. Interval decrease in the large confluent masslike area of consolidation involving the central right lung. Multiple irregular bilateral pulmonary nodules are also decreased in size as is the mediastinal lymphadenopathy. 2. Persistent diffuse heterogeneous mineralization of bony anatomy with interval development of superior endplate compression deformity at L3 associated with a central lucency in the L3 vertebral body. This is associated with apparent pathologic fracture of the left L3 transverse process. Overall imaging features remain concerning for diffuse, widespread bony metastatic involvement. 3. Stable to slight decrease and bilateral irregular adrenal lesions, concerning for metastatic involvement. 4. Thoracoabdominal aortic and coronary atherosclerosis. Electronically Signed   By: Misty Stanley M.D.   On: 03/27/2017 13:01    ASSESSMENT AND PLAN:  This is a very pleasant 71 years old African-American male with stage IV non-small cell lung cancer, adenocarcinoma with no actionable mutations and currently undergoing systemic chemotherapy was carboplatin and Alimta status post 3 cycles. The patient has been tolerating his treatment with systemic chemotherapy fairly well with no significant adverse effects except for fatigue. He had repeat CT scan of the chest, abdomen and pelvis  performed recently. I personally and independently reviewed the scan images and discuss the results with the patient today. PET scan showed improvement of his disease was decrease in the size of the large masslike consolidation involving the right lung as well as decrease in the mediastinal lymphadenopathy. I recommended for the patient to continue his current treatment with systemic chemotherapy with carboplatin and Alimta and he will proceed with cycle #4 today. I will see him back for follow-up visit in 3 weeks for evaluation before starting cycle #5. For hypertension, the patient will continue his current treatment with Coreg and hydrochlorothiazide. For pain management, he will continue on tramadol. The patient was advised to call immediately if he has any concerning symptoms in the interval. The patient voices understanding of current disease status and treatment options and is in agreement with the current care plan. All questions were answered. The patient knows to call the clinic with any problems, questions or concerns. We can certainly see the patient much sooner if necessary.  Disclaimer: This note was dictated with voice recognition software. Similar sounding words can inadvertently be transcribed and may not be corrected upon review.

## 2017-03-28 ENCOUNTER — Telehealth: Payer: Self-pay | Admitting: Internal Medicine

## 2017-03-28 NOTE — Telephone Encounter (Signed)
Scheduled additional appts per 5/30 LOS  - left message for patient to pick up new schedule next visit.

## 2017-04-03 ENCOUNTER — Other Ambulatory Visit (HOSPITAL_BASED_OUTPATIENT_CLINIC_OR_DEPARTMENT_OTHER): Payer: Medicare HMO

## 2017-04-03 DIAGNOSIS — C3491 Malignant neoplasm of unspecified part of right bronchus or lung: Secondary | ICD-10-CM

## 2017-04-03 DIAGNOSIS — C3411 Malignant neoplasm of upper lobe, right bronchus or lung: Secondary | ICD-10-CM | POA: Diagnosis not present

## 2017-04-03 LAB — COMPREHENSIVE METABOLIC PANEL
ALT: 7 U/L (ref 0–55)
ANION GAP: 10 meq/L (ref 3–11)
AST: 14 U/L (ref 5–34)
Albumin: 3.4 g/dL — ABNORMAL LOW (ref 3.5–5.0)
Alkaline Phosphatase: 61 U/L (ref 40–150)
BILIRUBIN TOTAL: 0.53 mg/dL (ref 0.20–1.20)
BUN: 16.1 mg/dL (ref 7.0–26.0)
CHLORIDE: 102 meq/L (ref 98–109)
CO2: 27 meq/L (ref 22–29)
CREATININE: 1.2 mg/dL (ref 0.7–1.3)
Calcium: 10.1 mg/dL (ref 8.4–10.4)
EGFR: 70 mL/min/{1.73_m2} — ABNORMAL LOW (ref >90)
GLUCOSE: 154 mg/dL — AB (ref 70–140)
Potassium: 4.1 mEq/L (ref 3.5–5.1)
SODIUM: 139 meq/L (ref 136–145)
TOTAL PROTEIN: 7 g/dL (ref 6.4–8.3)

## 2017-04-03 LAB — CBC WITH DIFFERENTIAL/PLATELET
BASO%: 1.1 % (ref 0.0–2.0)
Basophils Absolute: 0 10*3/uL (ref 0.0–0.1)
EOS%: 6.9 % (ref 0.0–7.0)
Eosinophils Absolute: 0.2 10*3/uL (ref 0.0–0.5)
HCT: 30.3 % — ABNORMAL LOW (ref 38.4–49.9)
HGB: 9.5 g/dL — ABNORMAL LOW (ref 13.0–17.1)
LYMPH%: 38.9 % (ref 14.0–49.0)
MCH: 27.4 pg (ref 27.2–33.4)
MCHC: 31.4 g/dL — AB (ref 32.0–36.0)
MCV: 87.3 fL (ref 79.3–98.0)
MONO#: 0.2 10*3/uL (ref 0.1–0.9)
MONO%: 6.2 % (ref 0.0–14.0)
NEUT%: 46.9 % (ref 39.0–75.0)
NEUTROS ABS: 1.3 10*3/uL — AB (ref 1.5–6.5)
Platelets: 263 10*3/uL (ref 140–400)
RBC: 3.47 10*6/uL — ABNORMAL LOW (ref 4.20–5.82)
RDW: 16.8 % — ABNORMAL HIGH (ref 11.0–14.6)
WBC: 2.8 10*3/uL — AB (ref 4.0–10.3)
lymph#: 1.1 10*3/uL (ref 0.9–3.3)

## 2017-04-10 ENCOUNTER — Other Ambulatory Visit (HOSPITAL_BASED_OUTPATIENT_CLINIC_OR_DEPARTMENT_OTHER): Payer: Medicare HMO

## 2017-04-10 DIAGNOSIS — C3411 Malignant neoplasm of upper lobe, right bronchus or lung: Secondary | ICD-10-CM | POA: Diagnosis not present

## 2017-04-10 DIAGNOSIS — C3491 Malignant neoplasm of unspecified part of right bronchus or lung: Secondary | ICD-10-CM

## 2017-04-10 LAB — CBC WITH DIFFERENTIAL/PLATELET
BASO%: 0.5 % (ref 0.0–2.0)
BASOS ABS: 0 10*3/uL (ref 0.0–0.1)
EOS%: 5.5 % (ref 0.0–7.0)
Eosinophils Absolute: 0.2 10*3/uL (ref 0.0–0.5)
HEMATOCRIT: 31.2 % — AB (ref 38.4–49.9)
HGB: 9.7 g/dL — ABNORMAL LOW (ref 13.0–17.1)
LYMPH#: 1.4 10*3/uL (ref 0.9–3.3)
LYMPH%: 36.4 % (ref 14.0–49.0)
MCH: 27.6 pg (ref 27.2–33.4)
MCHC: 31.1 g/dL — AB (ref 32.0–36.0)
MCV: 88.6 fL (ref 79.3–98.0)
MONO#: 0.6 10*3/uL (ref 0.1–0.9)
MONO%: 14.8 % — ABNORMAL HIGH (ref 0.0–14.0)
NEUT#: 1.7 10*3/uL (ref 1.5–6.5)
NEUT%: 42.8 % (ref 39.0–75.0)
PLATELETS: 131 10*3/uL — AB (ref 140–400)
RBC: 3.52 10*6/uL — AB (ref 4.20–5.82)
RDW: 18 % — ABNORMAL HIGH (ref 11.0–14.6)
WBC: 3.9 10*3/uL — ABNORMAL LOW (ref 4.0–10.3)

## 2017-04-10 LAB — COMPREHENSIVE METABOLIC PANEL
ALT: 12 U/L (ref 0–55)
ANION GAP: 12 meq/L — AB (ref 3–11)
AST: 22 U/L (ref 5–34)
Albumin: 3.3 g/dL — ABNORMAL LOW (ref 3.5–5.0)
Alkaline Phosphatase: 81 U/L (ref 40–150)
BUN: 14.5 mg/dL (ref 7.0–26.0)
CALCIUM: 9.6 mg/dL (ref 8.4–10.4)
CHLORIDE: 108 meq/L (ref 98–109)
CO2: 26 meq/L (ref 22–29)
Creatinine: 1 mg/dL (ref 0.7–1.3)
EGFR: 88 mL/min/{1.73_m2} — AB (ref >90)
Glucose: 110 mg/dl (ref 70–140)
Potassium: 3.7 mEq/L (ref 3.5–5.1)
Sodium: 147 mEq/L — ABNORMAL HIGH (ref 136–145)
Total Bilirubin: 0.35 mg/dL (ref 0.20–1.20)
Total Protein: 7.1 g/dL (ref 6.4–8.3)

## 2017-04-17 ENCOUNTER — Ambulatory Visit (HOSPITAL_BASED_OUTPATIENT_CLINIC_OR_DEPARTMENT_OTHER): Payer: Medicare HMO

## 2017-04-17 ENCOUNTER — Encounter: Payer: Self-pay | Admitting: Internal Medicine

## 2017-04-17 ENCOUNTER — Telehealth: Payer: Self-pay | Admitting: Internal Medicine

## 2017-04-17 ENCOUNTER — Ambulatory Visit (HOSPITAL_BASED_OUTPATIENT_CLINIC_OR_DEPARTMENT_OTHER): Payer: Medicare HMO | Admitting: Internal Medicine

## 2017-04-17 ENCOUNTER — Other Ambulatory Visit (HOSPITAL_BASED_OUTPATIENT_CLINIC_OR_DEPARTMENT_OTHER): Payer: Medicare HMO

## 2017-04-17 ENCOUNTER — Encounter: Payer: Self-pay | Admitting: *Deleted

## 2017-04-17 VITALS — BP 121/85 | HR 113 | Temp 97.2°F | Resp 20 | Ht 71.0 in | Wt 184.2 lb

## 2017-04-17 DIAGNOSIS — C3491 Malignant neoplasm of unspecified part of right bronchus or lung: Secondary | ICD-10-CM

## 2017-04-17 DIAGNOSIS — C7971 Secondary malignant neoplasm of right adrenal gland: Secondary | ICD-10-CM

## 2017-04-17 DIAGNOSIS — C3411 Malignant neoplasm of upper lobe, right bronchus or lung: Secondary | ICD-10-CM

## 2017-04-17 DIAGNOSIS — C7951 Secondary malignant neoplasm of bone: Secondary | ICD-10-CM

## 2017-04-17 DIAGNOSIS — Z5111 Encounter for antineoplastic chemotherapy: Secondary | ICD-10-CM

## 2017-04-17 DIAGNOSIS — E119 Type 2 diabetes mellitus without complications: Secondary | ICD-10-CM

## 2017-04-17 DIAGNOSIS — C7972 Secondary malignant neoplasm of left adrenal gland: Secondary | ICD-10-CM

## 2017-04-17 DIAGNOSIS — I10 Essential (primary) hypertension: Secondary | ICD-10-CM

## 2017-04-17 LAB — CBC WITH DIFFERENTIAL/PLATELET
BASO%: 0.2 % (ref 0.0–2.0)
BASOS ABS: 0 10*3/uL (ref 0.0–0.1)
EOS%: 0 % (ref 0.0–7.0)
Eosinophils Absolute: 0 10*3/uL (ref 0.0–0.5)
HEMATOCRIT: 31.8 % — AB (ref 38.4–49.9)
HGB: 10.3 g/dL — ABNORMAL LOW (ref 13.0–17.1)
LYMPH%: 11.3 % — AB (ref 14.0–49.0)
MCH: 28.5 pg (ref 27.2–33.4)
MCHC: 32.3 g/dL (ref 32.0–36.0)
MCV: 88.1 fL (ref 79.3–98.0)
MONO#: 0.4 10*3/uL (ref 0.1–0.9)
MONO%: 8.6 % (ref 0.0–14.0)
NEUT#: 4.1 10*3/uL (ref 1.5–6.5)
NEUT%: 79.9 % — AB (ref 39.0–75.0)
PLATELETS: 282 10*3/uL (ref 140–400)
RBC: 3.61 10*6/uL — AB (ref 4.20–5.82)
RDW: 21.4 % — ABNORMAL HIGH (ref 11.0–14.6)
WBC: 5.1 10*3/uL (ref 4.0–10.3)
lymph#: 0.6 10*3/uL — ABNORMAL LOW (ref 0.9–3.3)

## 2017-04-17 LAB — COMPREHENSIVE METABOLIC PANEL
ALT: 11 U/L (ref 0–55)
ANION GAP: 12 meq/L — AB (ref 3–11)
AST: 16 U/L (ref 5–34)
Albumin: 3.6 g/dL (ref 3.5–5.0)
Alkaline Phosphatase: 77 U/L (ref 40–150)
BUN: 17.8 mg/dL (ref 7.0–26.0)
CHLORIDE: 106 meq/L (ref 98–109)
CO2: 22 meq/L (ref 22–29)
CREATININE: 1.2 mg/dL (ref 0.7–1.3)
Calcium: 10 mg/dL (ref 8.4–10.4)
EGFR: 68 mL/min/{1.73_m2} — AB (ref >90)
Glucose: 195 mg/dl — ABNORMAL HIGH (ref 70–140)
Potassium: 4.1 mEq/L (ref 3.5–5.1)
Sodium: 141 mEq/L (ref 136–145)
Total Bilirubin: 0.41 mg/dL (ref 0.20–1.20)
Total Protein: 7.5 g/dL (ref 6.4–8.3)

## 2017-04-17 MED ORDER — PALONOSETRON HCL INJECTION 0.25 MG/5ML
INTRAVENOUS | Status: AC
  Filled 2017-04-17: qty 5

## 2017-04-17 MED ORDER — SODIUM CHLORIDE 0.9 % IV SOLN
470.0000 mg | Freq: Once | INTRAVENOUS | Status: AC
  Administered 2017-04-17: 470 mg via INTRAVENOUS
  Filled 2017-04-17: qty 47

## 2017-04-17 MED ORDER — DEXAMETHASONE SODIUM PHOSPHATE 10 MG/ML IJ SOLN
10.0000 mg | Freq: Once | INTRAMUSCULAR | Status: AC
  Administered 2017-04-17: 10 mg via INTRAVENOUS

## 2017-04-17 MED ORDER — SODIUM CHLORIDE 0.9 % IV SOLN
Freq: Once | INTRAVENOUS | Status: AC
  Administered 2017-04-17: 13:00:00 via INTRAVENOUS

## 2017-04-17 MED ORDER — PALONOSETRON HCL INJECTION 0.25 MG/5ML
0.2500 mg | Freq: Once | INTRAVENOUS | Status: AC
  Administered 2017-04-17: 0.25 mg via INTRAVENOUS

## 2017-04-17 MED ORDER — SODIUM CHLORIDE 0.9 % IV SOLN
484.0000 mg/m2 | Freq: Once | INTRAVENOUS | Status: AC
  Administered 2017-04-17: 1000 mg via INTRAVENOUS
  Filled 2017-04-17: qty 40

## 2017-04-17 MED ORDER — DEXAMETHASONE SODIUM PHOSPHATE 10 MG/ML IJ SOLN
INTRAMUSCULAR | Status: AC
  Filled 2017-04-17: qty 1

## 2017-04-17 NOTE — Telephone Encounter (Signed)
No additional appts added per 6/20 los - appts already scheduled til the end of treatment plan.

## 2017-04-17 NOTE — Progress Notes (Signed)
[place] [phone]   [phone]  OFFICE PROGRESS NOTE  [name], MD [address] [place] [address]  DIAGNOSIS: Stage IV (T3, N3, M1b) non-small cell lung cancer, adenocarcinoma with negative EGFR, ALK, ROS 1 and BRAF mutations as well as negative PDL 1 expression diagnosed in February 2018 and presented with large right upper lobe lung mass with mediastinal and left cervical lymphadenopathy as well as metastatic disease to the adrenal glands and bones.  PRIOR THERAPY: None.  CURRENT THERAPY: Systemic chemotherapy with carboplatin for AUC of 5 and Alimta 500 MG/M2 every 3 weeks. Status post 3 cycles.  INTERVAL HISTORY: Jim [name] 71 y.o. male returns to the clinic today for follow-up visit. The patient is feeling fine today with no specific complaints except for fatigue. He has been tolerating his systemic chemotherapy was carboplatin and Alimta fairly well. He denied having any chest pain but continues to have shortness breath with exertion with mild cough and no hemoptysis. He has no significant weight loss or night sweats. He has no nausea, vomiting, diarrhea or constipation. He has no fever or chills. He had repeat CT scan of the chest, abdomen and pelvis performed recently and he is here for evaluation and discussion of his scan results.  MEDICAL HISTORY: Past Medical History:  Diagnosis Date  . Adenocarcinoma of right lung, stage 4 ([place]) 01/03/2017  . Alcohol abuse 02/06/2011   with alcohol withdrawal during hospital stay  . Angioedema 02/06/2011   lisinopril  . Cardiomyopathy 02/06/2011   Cardiomyopathy with systolic heart failure, newly diagnosed  . Confusion 02/06/2011   possibly secondary to cerebrovascular accident  versus dementia  . CVA (cerebral infarction) 02/06/2011   Ischemic acute infarct in right anterior cerebral artery territory  . Diabetes mellitus   . Encounter for antineoplastic  chemotherapy 01/03/2017  . Goals of care, counseling/discussion 01/03/2017  . Hypertension    Mild LVH per echo  . Kidney disease, chronic, stage III (GFR 30-59 ml/min)   . Left bundle branch block 02/06/2011  . Marijuana abuse 02/06/2011  . Nonischemic cardiomyopathy (HCC)    EF is 30 to 35%  . Periodontal disease   . Periodontal disease    extensive  . Tobacco abuse     ALLERGIES:  is allergic to metformin and related and lisinopril.  MEDICATIONS:  Current Outpatient Prescriptions  Medication Sig Dispense Refill  . albuterol (PROVENTIL HFA;VENTOLIN HFA) 108 (90 Base) MCG/ACT inhaler Inhale 1 puff into the lungs every 6 (six) hours as needed.    Marland Kitchen aspirin 81 MG tablet Take 81 mg by mouth daily.      Marland Kitchen buPROPion (WELLBUTRIN SR) 100 MG 12 hr tablet Take 100 mg by mouth 2 (two) times daily.    . carvedilol (COREG) 12.5 MG tablet Take 1 tablet by mouth daily.    . ciprofloxacin (CIPRO) 500 MG tablet Take 500 mg by mouth 2 (two) times daily.    . clopidogrel (PLAVIX) 75 MG tablet Take 75 mg by mouth daily with breakfast.    . dexamethasone (DECADRON) 4 MG tablet 4 mg by mouth twice a day the day before, day of and day after the chemotherapy every 3 weeks 40 tablet 1  . diphenhydrAMINE (BENADRYL) 25 MG tablet Take 1 tablet (25 mg total) by mouth every 6 (six) hours. 20 tablet 0  . famotidine (PEPCID) 20 MG tablet Take 1 tablet (20 mg total) by mouth 2 (two) times  daily. 10 tablet 0  . folic acid (FOLVITE) 1 MG tablet Take 1 tablet (1 mg total) by mouth daily. 30 tablet 4  . hydrochlorothiazide (HYDRODIURIL) 25 MG tablet Take 25 mg by mouth daily.    . Insulin Glargine (LANTUS) 100 UNIT/ML Solostar Pen Inject 14 Units into the skin 2 (two) times daily. Inject 28 units daily    . isosorbide mononitrate (IMDUR) 30 MG 24 hr tablet TAKE 1 TABLET BY MOUTH DAILY    . multivitamin (THERAGRAN) per tablet Take 1 tablet by mouth daily.      . nitroGLYCERIN (NITROSTAT) 0.4 MG SL tablet Place 0.4 mg under  the tongue every 5 (five) minutes as needed for chest pain.     . phenazopyridine (PYRIDIUM) 200 MG tablet Take 1 tablet (200 mg total) by mouth 3 (three) times daily as needed for pain. 30 tablet 0  . prochlorperazine (COMPAZINE) 10 MG tablet Take 1 tablet (10 mg total) by mouth every 6 (six) hours as needed for nausea or vomiting. 30 tablet 0  . rosuvastatin (CRESTOR) 10 MG tablet Take 10 mg by mouth daily.      . tamsulosin (FLOMAX) 0.4 MG CAPS capsule     . traMADol (ULTRAM) 50 MG tablet Take 50 mg by mouth every 8 (eight) hours as needed for moderate pain.    . hydrALAZINE (APRESOLINE) 25 MG tablet Take 1 tablet (25 mg total) by mouth 2 (two) times daily. 120 tablet 11  . isosorbide mononitrate (IMDUR) 30 MG 24 hr tablet Take 1 tablet (30 mg total) by mouth daily. 30 tablet 11  . traZODone (DESYREL) 50 MG tablet Take 50 mg by mouth at bedtime. Take 1/2 to 1 tablet hs     No current facility-administered medications for this visit.     SURGICAL HISTORY:  Past Surgical History:  Procedure Laterality Date  . US ECHOCARDIOGRAPHY  April 2012   EF 30 to 35 %    REVIEW OF SYSTEMS:  Constitutional: positive for fatigue Eyes: negative Ears, nose, mouth, throat, and face: negative Respiratory: positive for dyspnea on exertion Cardiovascular: negative Gastrointestinal: negative Genitourinary:negative Integument/breast: negative Hematologic/lymphatic: negative Musculoskeletal:positive for muscle weakness Neurological: negative Behavioral/Psych: negative Endocrine: negative Allergic/Immunologic: negative   PHYSICAL EXAMINATION: General appearance: alert, cooperative, fatigued and no distress Head: Normocephalic, without obvious abnormality, atraumatic Neck: no adenopathy, no JVD, supple, symmetrical, trachea midline and thyroid not enlarged, symmetric, no tenderness/mass/nodules Lymph nodes: Cervical, supraclavicular, and axillary nodes normal. Resp: clear to auscultation  bilaterally Back: symmetric, no curvature. ROM normal. No CVA tenderness. Cardio: regular rate and rhythm, S1, S2 normal, no murmur, click, rub or gallop GI: soft, non-tender; bowel sounds normal; no masses,  no organomegaly Extremities: extremities normal, atraumatic, no cyanosis or edema Neurologic: Alert and oriented X 3, normal strength and tone. Normal symmetric reflexes. Normal coordination and gait  ECOG PERFORMANCE STATUS: 1 - Symptomatic but completely ambulatory  Blood pressure 121/85, pulse (!) 113, temperature 97.2 F (36.2 C), temperature source Oral, resp. rate 20, height 5' 11"  (1.803 m), weight 184 lb 3.2 oz (83.6 kg), SpO2 100 %.  LABORATORY DATA: Lab Results  Component Value Date   WBC 5.1 04/17/2017   HGB 10.3 (L) 04/17/2017   HCT 31.8 (L) 04/17/2017   MCV 88.1 04/17/2017   PLT 282 04/17/2017      Chemistry      Component Value Date/Time   NA 141 04/17/2017 1115   K 4.1 04/17/2017 1115   CL 99 11/06/2013 1940  CO2 22 04/17/2017 1115   BUN 17.8 04/17/2017 1115   CREATININE 1.2 04/17/2017 1115      Component Value Date/Time   CALCIUM 10.0 04/17/2017 1115   ALKPHOS 77 04/17/2017 1115   AST 16 04/17/2017 1115   ALT 11 04/17/2017 1115   BILITOT 0.41 04/17/2017 1115       RADIOGRAPHIC STUDIES: Ct Chest W Contrast  Result Date: 03/27/2017 CLINICAL DATA:  Right lung cancer. EXAM: CT CHEST, ABDOMEN, AND PELVIS WITH CONTRAST TECHNIQUE: Multidetector CT imaging of the chest, abdomen and pelvis was performed following the standard protocol during bolus administration of intravenous contrast. CONTRAST:  130m ISOVUE-300 IOPAMIDOL (ISOVUE-300) INJECTION 61% COMPARISON:  01/17/2017 FINDINGS: CT CHEST FINDINGS Cardiovascular: The heart size is normal. No pericardial effusion. Coronary artery calcification is noted. Mediastinum/Nodes: Mediastinal lymphadenopathy is slightly improved in the interval. Low left paratracheal lymph node measured previously at 1.8 cm short  axis now measures 1.4 cm (image 23 series 2). 1.5 cm high right paratracheal lymph node measured previously at 1.5 cm short axis now measures 1.0 cm. Abnormal soft tissue attenuation again noted in the right hilum. The esophagus has normal imaging features. There is no axillary lymphadenopathy. Lungs/Pleura: The central consolidative change seen in the right upper, middle, and lower lobes has decreased in the interval. As on the prior study, no definite discrete lung lesion is evident, but the area of hypoenhancement is again identified but measures smaller today at 4.6 x 3.1 cm compared to 6.1 x 4.2 cm previously. The scattered bilateral pulmonary nodules seen on the previous study have also decreased in the interval. 8 mm right middle lobe nodule (image 61 series 6) has decreased from 14 mm previously (remeasured). The 13 mm posterior left lower lobe nodule measured on the prior study now measures 8 mm. No new or progressive pulmonary nodule or mass. Small to moderate right pleural effusion is progressive since prior study. Scattered pleural calcification noted left hemithorax. Musculoskeletal: Heterogeneous bony mineralization is stable in the interval without a discrete sclerotic or lytic osseous abnormality. CT ABDOMEN PELVIS FINDINGS Hepatobiliary: No focal abnormality within the liver parenchyma. There is no evidence for gallstones, gallbladder wall thickening, or pericholecystic fluid. No intrahepatic or extrahepatic biliary dilation. Pancreas: No focal mass lesion. No dilatation of the main duct. No intraparenchymal cyst. No peripancreatic edema. Spleen: No splenomegaly. No focal mass lesion. Adrenals/Urinary Tract: Bilateral irregular adrenal lesions again identified, measuring 2.6 x 1.7 cm on the right (compared to 3.0 x 1.8 cm previously and 4.4 x 1.4 cm on the left (compare to 4.1 x 1.7 cm previously. 3 mm nonobstructing stone identified upper pole right kidney. Vascular calcification noted upper pole  left kidney. Stable appearance 4.7 cm upper pole left renal cyst. Small cyst upper pole right kidney is unchanged. No evidence for hydroureter. The urinary bladder appears normal for the degree of distention. Stomach/Bowel: Stomach is nondistended. No gastric wall thickening. No evidence of outlet obstruction. Duodenum is normally positioned as is the ligament of Treitz. No small bowel wall thickening. No small bowel dilatation. Stomach is nondistended. No gastric wall thickening. No evidence of outlet obstruction. The appendix is not visualized, but there is no edema or inflammation in the region of the cecum. Diverticular changes are noted in the left colon without evidence of diverticulitis. Vascular/Lymphatic: There is abdominal aortic atherosclerosis without aneurysm. There is no gastrohepatic or hepatoduodenal ligament lymphadenopathy. No intraperitoneal or retroperitoneal lymphadenopathy. No pelvic sidewall lymphadenopathy. Reproductive: Prostate gland is enlarged. Other: No intraperitoneal free fluid.  Musculoskeletal: Diffuse heterogeneous mineralization again noted. There is superior endplate fracture at L3 with associated focal lucency in the vertebral body best seen on image 89 of sagittal series 5. Patient is also noted to have pathologic fracture of the left L3 transverse process (image 75 series 2) new since prior study. IMPRESSION: 1. Interval decrease in the large confluent masslike area of consolidation involving the central right lung. Multiple irregular bilateral pulmonary nodules are also decreased in size as is the mediastinal lymphadenopathy. 2. Persistent diffuse heterogeneous mineralization of bony anatomy with interval development of superior endplate compression deformity at L3 associated with a central lucency in the L3 vertebral body. This is associated with apparent pathologic fracture of the left L3 transverse process. Overall imaging features remain concerning for diffuse, widespread  bony metastatic involvement. 3. Stable to slight decrease and bilateral irregular adrenal lesions, concerning for metastatic involvement. 4. Thoracoabdominal aortic and coronary atherosclerosis. Electronically Signed   By: Misty Stanley M.D.   On: 03/27/2017 13:01   Ct Abdomen Pelvis W Contrast  Result Date: 03/27/2017 CLINICAL DATA:  Right lung cancer. EXAM: CT CHEST, ABDOMEN, AND PELVIS WITH CONTRAST TECHNIQUE: Multidetector CT imaging of the chest, abdomen and pelvis was performed following the standard protocol during bolus administration of intravenous contrast. CONTRAST:  142m ISOVUE-300 IOPAMIDOL (ISOVUE-300) INJECTION 61% COMPARISON:  01/17/2017 FINDINGS: CT CHEST FINDINGS Cardiovascular: The heart size is normal. No pericardial effusion. Coronary artery calcification is noted. Mediastinum/Nodes: Mediastinal lymphadenopathy is slightly improved in the interval. Low left paratracheal lymph node measured previously at 1.8 cm short axis now measures 1.4 cm (image 23 series 2). 1.5 cm high right paratracheal lymph node measured previously at 1.5 cm short axis now measures 1.0 cm. Abnormal soft tissue attenuation again noted in the right hilum. The esophagus has normal imaging features. There is no axillary lymphadenopathy. Lungs/Pleura: The central consolidative change seen in the right upper, middle, and lower lobes has decreased in the interval. As on the prior study, no definite discrete lung lesion is evident, but the area of hypoenhancement is again identified but measures smaller today at 4.6 x 3.1 cm compared to 6.1 x 4.2 cm previously. The scattered bilateral pulmonary nodules seen on the previous study have also decreased in the interval. 8 mm right middle lobe nodule (image 61 series 6) has decreased from 14 mm previously (remeasured). The 13 mm posterior left lower lobe nodule measured on the prior study now measures 8 mm. No new or progressive pulmonary nodule or mass. Small to moderate right  pleural effusion is progressive since prior study. Scattered pleural calcification noted left hemithorax. Musculoskeletal: Heterogeneous bony mineralization is stable in the interval without a discrete sclerotic or lytic osseous abnormality. CT ABDOMEN PELVIS FINDINGS Hepatobiliary: No focal abnormality within the liver parenchyma. There is no evidence for gallstones, gallbladder wall thickening, or pericholecystic fluid. No intrahepatic or extrahepatic biliary dilation. Pancreas: No focal mass lesion. No dilatation of the main duct. No intraparenchymal cyst. No peripancreatic edema. Spleen: No splenomegaly. No focal mass lesion. Adrenals/Urinary Tract: Bilateral irregular adrenal lesions again identified, measuring 2.6 x 1.7 cm on the right (compared to 3.0 x 1.8 cm previously and 4.4 x 1.4 cm on the left (compare to 4.1 x 1.7 cm previously. 3 mm nonobstructing stone identified upper pole right kidney. Vascular calcification noted upper pole left kidney. Stable appearance 4.7 cm upper pole left renal cyst. Small cyst upper pole right kidney is unchanged. No evidence for hydroureter. The urinary bladder appears normal for the degree  of distention. Stomach/Bowel: Stomach is nondistended. No gastric wall thickening. No evidence of outlet obstruction. Duodenum is normally positioned as is the ligament of Treitz. No small bowel wall thickening. No small bowel dilatation. Stomach is nondistended. No gastric wall thickening. No evidence of outlet obstruction. The appendix is not visualized, but there is no edema or inflammation in the region of the cecum. Diverticular changes are noted in the left colon without evidence of diverticulitis. Vascular/Lymphatic: There is abdominal aortic atherosclerosis without aneurysm. There is no gastrohepatic or hepatoduodenal ligament lymphadenopathy. No intraperitoneal or retroperitoneal lymphadenopathy. No pelvic sidewall lymphadenopathy. Reproductive: Prostate gland is enlarged.  Other: No intraperitoneal free fluid. Musculoskeletal: Diffuse heterogeneous mineralization again noted. There is superior endplate fracture at L3 with associated focal lucency in the vertebral body best seen on image 89 of sagittal series 5. Patient is also noted to have pathologic fracture of the left L3 transverse process (image 75 series 2) new since prior study. IMPRESSION: 1. Interval decrease in the large confluent masslike area of consolidation involving the central right lung. Multiple irregular bilateral pulmonary nodules are also decreased in size as is the mediastinal lymphadenopathy. 2. Persistent diffuse heterogeneous mineralization of bony anatomy with interval development of superior endplate compression deformity at L3 associated with a central lucency in the L3 vertebral body. This is associated with apparent pathologic fracture of the left L3 transverse process. Overall imaging features remain concerning for diffuse, widespread bony metastatic involvement. 3. Stable to slight decrease and bilateral irregular adrenal lesions, concerning for metastatic involvement. 4. Thoracoabdominal aortic and coronary atherosclerosis. Electronically Signed   By: Misty Stanley M.D.   On: 03/27/2017 13:01    ASSESSMENT AND PLAN:  This is a very pleasant 71 years old African-American male with stage IV non-small cell lung cancer, adenocarcinoma with no actionable mutations and currently undergoing systemic chemotherapy was carboplatin and Alimta status post 3 cycles. The patient has been tolerating his treatment with systemic chemotherapy fairly well with no significant adverse effects except for fatigue. He had repeat CT scan of the chest, abdomen and pelvis performed recently. I personally and independently reviewed the scan images and discuss the results with the patient today. PET scan showed improvement of his disease was decrease in the size of the large masslike consolidation involving the right lung  as well as decrease in the mediastinal lymphadenopathy. I recommended for the patient to continue his current treatment with systemic chemotherapy with carboplatin and Alimta and he will proceed with cycle #4 today. I will see him back for follow-up visit in 3 weeks for evaluation before starting cycle #5. For hypertension, the patient will continue his current treatment with Coreg and hydrochlorothiazide. For pain management, he will continue on tramadol. The patient was advised to call immediately if he has any concerning symptoms in the interval. The patient voices understanding of current disease status and treatment options and is in agreement with the current care plan. All questions were answered. The patient knows to call the clinic with any problems, questions or concerns. We can certainly see the patient much sooner if necessary.  Disclaimer: This note was dictated with voice recognition software. Similar sounding words can inadvertently be transcribed and may not be corrected upon review.            [place] Telephone:([phone]) [phone]   [phone]) [phone]  OFFICE PROGRESS NOTE  [name], MD [address] [place] [address]  DIAGNOSIS: Stage IV (T3, N3, M1b) non-small  cell lung cancer, adenocarcinoma with negative EGFR, ALK, ROS 1 and BRAF mutations as well as negative PDL 1 expression diagnosed in February 2018 and presented with large right upper lobe lung mass with mediastinal and left cervical lymphadenopathy as well as metastatic disease to the adrenal glands and bones.  PRIOR THERAPY: None.  CURRENT THERAPY: Systemic chemotherapy with carboplatin for AUC of 5 and Alimta 500 MG/M2 every 3 weeks. Status post 4 cycles.  INTERVAL HISTORY: Jim [name] 71 y.o. male returns to the clinic today for follow-up visit. The patient is currently undergoing systemic chemotherapy with carboplatin and Alimta status post 4 cycles and has  been tolerating this treatment well. He denied having any chest pain, shortness of breath, cough or hemoptysis. He has no nausea, vomiting, diarrhea or constipation. He denied having any recent weight loss or night sweats. He is here today for evaluation before starting cycle #5.   MEDICAL HISTORY: Past Medical History:  Diagnosis Date  . Adenocarcinoma of right lung, stage 4 ([place]) 01/03/2017  . Alcohol abuse 02/06/2011   with alcohol withdrawal during hospital stay  . Angioedema 02/06/2011   lisinopril  . Cardiomyopathy 02/06/2011   Cardiomyopathy with systolic heart failure, newly diagnosed  . Confusion 02/06/2011   possibly secondary to cerebrovascular accident  versus dementia  . CVA (cerebral infarction) 02/06/2011   Ischemic acute infarct in right anterior cerebral artery territory  . Diabetes mellitus   . Encounter for antineoplastic chemotherapy 01/03/2017  . Goals of care, counseling/discussion 01/03/2017  . Hypertension    Mild LVH per echo  . Kidney disease, chronic, stage III (GFR 30-59 ml/min)   . Left bundle branch block 02/06/2011  . Marijuana abuse 02/06/2011  . Nonischemic cardiomyopathy (HCC)    EF is 30 to 35%  . Periodontal disease   . Periodontal disease    extensive  . Tobacco abuse     ALLERGIES:  is allergic to metformin and related and lisinopril.  MEDICATIONS:  Current Outpatient Prescriptions  Medication Sig Dispense Refill  . albuterol (PROVENTIL HFA;VENTOLIN HFA) 108 (90 Base) MCG/ACT inhaler Inhale 1 puff into the lungs every 6 (six) hours as needed.    Marland Kitchen aspirin 81 MG tablet Take 81 mg by mouth daily.      Marland Kitchen buPROPion (WELLBUTRIN SR) 100 MG 12 hr tablet Take 100 mg by mouth 2 (two) times daily.    . carvedilol (COREG) 12.5 MG tablet Take 1 tablet by mouth daily.    . ciprofloxacin (CIPRO) 500 MG tablet Take 500 mg by mouth 2 (two) times daily.    . clopidogrel (PLAVIX) 75 MG tablet Take 75 mg by mouth daily with breakfast.    . dexamethasone (DECADRON) 4  MG tablet 4 mg by mouth twice a day the day before, day of and day after the chemotherapy every 3 weeks 40 tablet 1  . diphenhydrAMINE (BENADRYL) 25 MG tablet Take 1 tablet (25 mg total) by mouth every 6 (six) hours. 20 tablet 0  . famotidine (PEPCID) 20 MG tablet Take 1 tablet (20 mg total) by mouth 2 (two) times daily. 10 tablet 0  . folic acid (FOLVITE) 1 MG tablet Take 1 tablet (1 mg total) by mouth daily. 30 tablet 4  . hydrochlorothiazide (HYDRODIURIL) 25 MG tablet Take 25 mg by mouth daily.    . Insulin Glargine (LANTUS) 100 UNIT/ML Solostar Pen Inject 14 Units into the skin 2 (two) times daily. Inject 28 units daily    . isosorbide mononitrate (IMDUR) 30  MG 24 hr tablet TAKE 1 TABLET BY MOUTH DAILY    . multivitamin (THERAGRAN) per tablet Take 1 tablet by mouth daily.      . nitroGLYCERIN (NITROSTAT) 0.4 MG SL tablet Place 0.4 mg under the tongue every 5 (five) minutes as needed for chest pain.     . phenazopyridine (PYRIDIUM) 200 MG tablet Take 1 tablet (200 mg total) by mouth 3 (three) times daily as needed for pain. 30 tablet 0  . prochlorperazine (COMPAZINE) 10 MG tablet Take 1 tablet (10 mg total) by mouth every 6 (six) hours as needed for nausea or vomiting. 30 tablet 0  . rosuvastatin (CRESTOR) 10 MG tablet Take 10 mg by mouth daily.      . tamsulosin (FLOMAX) 0.4 MG CAPS capsule     . traMADol (ULTRAM) 50 MG tablet Take 50 mg by mouth every 8 (eight) hours as needed for moderate pain.    . hydrALAZINE (APRESOLINE) 25 MG tablet Take 1 tablet (25 mg total) by mouth 2 (two) times daily. 120 tablet 11  . isosorbide mononitrate (IMDUR) 30 MG 24 hr tablet Take 1 tablet (30 mg total) by mouth daily. 30 tablet 11  . traZODone (DESYREL) 50 MG tablet Take 50 mg by mouth at bedtime. Take 1/2 to 1 tablet hs     No current facility-administered medications for this visit.     SURGICAL HISTORY:  Past Surgical History:  Procedure Laterality Date  . US ECHOCARDIOGRAPHY  April 2012   EF 30 to  35 %    REVIEW OF SYSTEMS:  A comprehensive review of systems was negative except for: Constitutional: positive for fatigue   PHYSICAL EXAMINATION: General appearance: alert, cooperative, fatigued and no distress Head: Normocephalic, without obvious abnormality, atraumatic Neck: no adenopathy, no JVD, supple, symmetrical, trachea midline and thyroid not enlarged, symmetric, no tenderness/mass/nodules Lymph nodes: Cervical, supraclavicular, and axillary nodes normal. Resp: clear to auscultation bilaterally Back: symmetric, no curvature. ROM normal. No CVA tenderness. Cardio: regular rate and rhythm, S1, S2 normal, no murmur, click, rub or gallop GI: soft, non-tender; bowel sounds normal; no masses,  no organomegaly Extremities: extremities normal, atraumatic, no cyanosis or edema  ECOG PERFORMANCE STATUS: 1 - Symptomatic but completely ambulatory  Blood pressure 121/85, pulse (!) 113, temperature 97.2 F (36.2 C), temperature source Oral, resp. rate 20, height 5' 11"  (1.803 m), weight 184 lb 3.2 oz (83.6 kg), SpO2 100 %.  LABORATORY DATA: Lab Results  Component Value Date   WBC 5.1 04/17/2017   HGB 10.3 (L) 04/17/2017   HCT 31.8 (L) 04/17/2017   MCV 88.1 04/17/2017   PLT 282 04/17/2017      Chemistry      Component Value Date/Time   NA 141 04/17/2017 1115   K 4.1 04/17/2017 1115   CL 99 11/06/2013 1940   CO2 22 04/17/2017 1115   BUN 17.8 04/17/2017 1115   CREATININE 1.2 04/17/2017 1115      Component Value Date/Time   CALCIUM 10.0 04/17/2017 1115   ALKPHOS 77 04/17/2017 1115   AST 16 04/17/2017 1115   ALT 11 04/17/2017 1115   BILITOT 0.41 04/17/2017 1115       RADIOGRAPHIC STUDIES: Ct Chest W Contrast  Result Date: 03/27/2017 CLINICAL DATA:  Right lung cancer. EXAM: CT CHEST, ABDOMEN, AND PELVIS WITH CONTRAST TECHNIQUE: Multidetector CT imaging of the chest, abdomen and pelvis was performed following the standard protocol during bolus administration of intravenous  contrast. CONTRAST:  133m ISOVUE-300 IOPAMIDOL (ISOVUE-300) INJECTION 61%  COMPARISON:  01/17/2017 FINDINGS: CT CHEST FINDINGS Cardiovascular: The heart size is normal. No pericardial effusion. Coronary artery calcification is noted. Mediastinum/Nodes: Mediastinal lymphadenopathy is slightly improved in the interval. Low left paratracheal lymph node measured previously at 1.8 cm short axis now measures 1.4 cm (image 23 series 2). 1.5 cm high right paratracheal lymph node measured previously at 1.5 cm short axis now measures 1.0 cm. Abnormal soft tissue attenuation again noted in the right hilum. The esophagus has normal imaging features. There is no axillary lymphadenopathy. Lungs/Pleura: The central consolidative change seen in the right upper, middle, and lower lobes has decreased in the interval. As on the prior study, no definite discrete lung lesion is evident, but the area of hypoenhancement is again identified but measures smaller today at 4.6 x 3.1 cm compared to 6.1 x 4.2 cm previously. The scattered bilateral pulmonary nodules seen on the previous study have also decreased in the interval. 8 mm right middle lobe nodule (image 61 series 6) has decreased from 14 mm previously (remeasured). The 13 mm posterior left lower lobe nodule measured on the prior study now measures 8 mm. No new or progressive pulmonary nodule or mass. Small to moderate right pleural effusion is progressive since prior study. Scattered pleural calcification noted left hemithorax. Musculoskeletal: Heterogeneous bony mineralization is stable in the interval without a discrete sclerotic or lytic osseous abnormality. CT ABDOMEN PELVIS FINDINGS Hepatobiliary: No focal abnormality within the liver parenchyma. There is no evidence for gallstones, gallbladder wall thickening, or pericholecystic fluid. No intrahepatic or extrahepatic biliary dilation. Pancreas: No focal mass lesion. No dilatation of the main duct. No intraparenchymal cyst. No  peripancreatic edema. Spleen: No splenomegaly. No focal mass lesion. Adrenals/Urinary Tract: Bilateral irregular adrenal lesions again identified, measuring 2.6 x 1.7 cm on the right (compared to 3.0 x 1.8 cm previously and 4.4 x 1.4 cm on the left (compare to 4.1 x 1.7 cm previously. 3 mm nonobstructing stone identified upper pole right kidney. Vascular calcification noted upper pole left kidney. Stable appearance 4.7 cm upper pole left renal cyst. Small cyst upper pole right kidney is unchanged. No evidence for hydroureter. The urinary bladder appears normal for the degree of distention. Stomach/Bowel: Stomach is nondistended. No gastric wall thickening. No evidence of outlet obstruction. Duodenum is normally positioned as is the ligament of Treitz. No small bowel wall thickening. No small bowel dilatation. Stomach is nondistended. No gastric wall thickening. No evidence of outlet obstruction. The appendix is not visualized, but there is no edema or inflammation in the region of the cecum. Diverticular changes are noted in the left colon without evidence of diverticulitis. Vascular/Lymphatic: There is abdominal aortic atherosclerosis without aneurysm. There is no gastrohepatic or hepatoduodenal ligament lymphadenopathy. No intraperitoneal or retroperitoneal lymphadenopathy. No pelvic sidewall lymphadenopathy. Reproductive: Prostate gland is enlarged. Other: No intraperitoneal free fluid. Musculoskeletal: Diffuse heterogeneous mineralization again noted. There is superior endplate fracture at L3 with associated focal lucency in the vertebral body best seen on image 89 of sagittal series 5. Patient is also noted to have pathologic fracture of the left L3 transverse process (image 75 series 2) new since prior study. IMPRESSION: 1. Interval decrease in the large confluent masslike area of consolidation involving the central right lung. Multiple irregular bilateral pulmonary nodules are also decreased in size as is the  mediastinal lymphadenopathy. 2. Persistent diffuse heterogeneous mineralization of bony anatomy with interval development of superior endplate compression deformity at L3 associated with a central lucency in the L3 vertebral body. This is  associated with apparent pathologic fracture of the left L3 transverse process. Overall imaging features remain concerning for diffuse, widespread bony metastatic involvement. 3. Stable to slight decrease and bilateral irregular adrenal lesions, concerning for metastatic involvement. 4. Thoracoabdominal aortic and coronary atherosclerosis. Electronically Signed   By: Misty Stanley M.D.   On: 03/27/2017 13:01   Ct Abdomen Pelvis W Contrast  Result Date: 03/27/2017 CLINICAL DATA:  Right lung cancer. EXAM: CT CHEST, ABDOMEN, AND PELVIS WITH CONTRAST TECHNIQUE: Multidetector CT imaging of the chest, abdomen and pelvis was performed following the standard protocol during bolus administration of intravenous contrast. CONTRAST:  163m ISOVUE-300 IOPAMIDOL (ISOVUE-300) INJECTION 61% COMPARISON:  01/17/2017 FINDINGS: CT CHEST FINDINGS Cardiovascular: The heart size is normal. No pericardial effusion. Coronary artery calcification is noted. Mediastinum/Nodes: Mediastinal lymphadenopathy is slightly improved in the interval. Low left paratracheal lymph node measured previously at 1.8 cm short axis now measures 1.4 cm (image 23 series 2). 1.5 cm high right paratracheal lymph node measured previously at 1.5 cm short axis now measures 1.0 cm. Abnormal soft tissue attenuation again noted in the right hilum. The esophagus has normal imaging features. There is no axillary lymphadenopathy. Lungs/Pleura: The central consolidative change seen in the right upper, middle, and lower lobes has decreased in the interval. As on the prior study, no definite discrete lung lesion is evident, but the area of hypoenhancement is again identified but measures smaller today at 4.6 x 3.1 cm compared to 6.1 x 4.2  cm previously. The scattered bilateral pulmonary nodules seen on the previous study have also decreased in the interval. 8 mm right middle lobe nodule (image 61 series 6) has decreased from 14 mm previously (remeasured). The 13 mm posterior left lower lobe nodule measured on the prior study now measures 8 mm. No new or progressive pulmonary nodule or mass. Small to moderate right pleural effusion is progressive since prior study. Scattered pleural calcification noted left hemithorax. Musculoskeletal: Heterogeneous bony mineralization is stable in the interval without a discrete sclerotic or lytic osseous abnormality. CT ABDOMEN PELVIS FINDINGS Hepatobiliary: No focal abnormality within the liver parenchyma. There is no evidence for gallstones, gallbladder wall thickening, or pericholecystic fluid. No intrahepatic or extrahepatic biliary dilation. Pancreas: No focal mass lesion. No dilatation of the main duct. No intraparenchymal cyst. No peripancreatic edema. Spleen: No splenomegaly. No focal mass lesion. Adrenals/Urinary Tract: Bilateral irregular adrenal lesions again identified, measuring 2.6 x 1.7 cm on the right (compared to 3.0 x 1.8 cm previously and 4.4 x 1.4 cm on the left (compare to 4.1 x 1.7 cm previously. 3 mm nonobstructing stone identified upper pole right kidney. Vascular calcification noted upper pole left kidney. Stable appearance 4.7 cm upper pole left renal cyst. Small cyst upper pole right kidney is unchanged. No evidence for hydroureter. The urinary bladder appears normal for the degree of distention. Stomach/Bowel: Stomach is nondistended. No gastric wall thickening. No evidence of outlet obstruction. Duodenum is normally positioned as is the ligament of Treitz. No small bowel wall thickening. No small bowel dilatation. Stomach is nondistended. No gastric wall thickening. No evidence of outlet obstruction. The appendix is not visualized, but there is no edema or inflammation in the region of  the cecum. Diverticular changes are noted in the left colon without evidence of diverticulitis. Vascular/Lymphatic: There is abdominal aortic atherosclerosis without aneurysm. There is no gastrohepatic or hepatoduodenal ligament lymphadenopathy. No intraperitoneal or retroperitoneal lymphadenopathy. No pelvic sidewall lymphadenopathy. Reproductive: Prostate gland is enlarged. Other: No intraperitoneal free fluid. Musculoskeletal: Diffuse heterogeneous  mineralization again noted. There is superior endplate fracture at L3 with associated focal lucency in the vertebral body best seen on image 89 of sagittal series 5. Patient is also noted to have pathologic fracture of the left L3 transverse process (image 75 series 2) new since prior study. IMPRESSION: 1. Interval decrease in the large confluent masslike area of consolidation involving the central right lung. Multiple irregular bilateral pulmonary nodules are also decreased in size as is the mediastinal lymphadenopathy. 2. Persistent diffuse heterogeneous mineralization of bony anatomy with interval development of superior endplate compression deformity at L3 associated with a central lucency in the L3 vertebral body. This is associated with apparent pathologic fracture of the left L3 transverse process. Overall imaging features remain concerning for diffuse, widespread bony metastatic involvement. 3. Stable to slight decrease and bilateral irregular adrenal lesions, concerning for metastatic involvement. 4. Thoracoabdominal aortic and coronary atherosclerosis. Electronically Signed   By: Misty Stanley M.D.   On: 03/27/2017 13:01    ASSESSMENT AND PLAN:  This is a very pleasant 71 years old African-American male with a stage IV non-small cell lung cancer, adenocarcinoma with no actionable mutations and negative PDL 1 expression. He is currently undergoing systemic chemotherapy with carboplatin, and Alimta status post 4 cycles and has been tolerating this  treatment fairly well. I recommended for the patient to proceed with cycle #5 today as scheduled. He would come back for follow-up visit in 3 weeks for evaluation before starting cycle #6. The patient will continue on his home medication for the blood pressure and pain management. He was advised to call immediately if he has any concerning symptoms in the interval. The patient voices understanding of current disease status and treatment options and is in agreement with the current care plan. All questions were answered. The patient knows to call the clinic with any problems, questions or concerns. We can certainly see the patient much sooner if necessary. I spent 10 minutes counseling the patient face to face. The total time spent in the appointment was 15 minutes.  Disclaimer: This note was dictated with voice recognition software. Similar sounding words can inadvertently be transcribed and may not be corrected upon review.

## 2017-04-17 NOTE — Progress Notes (Signed)
Oncology Nurse Navigator Documentation  Oncology Nurse Navigator Flowsheets 04/17/2017  Navigator Location [facility]  Navigator Encounter Type Clinic/MDC/spoke with patient today at [facility].  I educated on treatment regimen.  He verbalized understanding.   Patient Visit Type MedOnc  Treatment Phase Treatment  Barriers/Navigation Needs Education  Education Other  Interventions Education  Education Method Verbal  Acuity Level 1  Time Spent with Patient 15

## 2017-04-24 ENCOUNTER — Other Ambulatory Visit (HOSPITAL_BASED_OUTPATIENT_CLINIC_OR_DEPARTMENT_OTHER): Payer: Medicare HMO

## 2017-04-24 DIAGNOSIS — C3411 Malignant neoplasm of upper lobe, right bronchus or lung: Secondary | ICD-10-CM

## 2017-04-24 DIAGNOSIS — C3491 Malignant neoplasm of unspecified part of right bronchus or lung: Secondary | ICD-10-CM

## 2017-04-24 LAB — CBC WITH DIFFERENTIAL/PLATELET
BASO%: 0.8 % (ref 0.0–2.0)
BASOS ABS: 0 10*3/uL (ref 0.0–0.1)
EOS ABS: 0.2 10*3/uL (ref 0.0–0.5)
EOS%: 5.7 % (ref 0.0–7.0)
HEMATOCRIT: 29.7 % — AB (ref 38.4–49.9)
HEMOGLOBIN: 9.3 g/dL — AB (ref 13.0–17.1)
LYMPH#: 1 10*3/uL (ref 0.9–3.3)
LYMPH%: 36.9 % (ref 14.0–49.0)
MCH: 28.3 pg (ref 27.2–33.4)
MCHC: 31.3 g/dL — ABNORMAL LOW (ref 32.0–36.0)
MCV: 90.3 fL (ref 79.3–98.0)
MONO#: 0.3 10*3/uL (ref 0.1–0.9)
MONO%: 11.4 % (ref 0.0–14.0)
NEUT#: 1.2 10*3/uL — ABNORMAL LOW (ref 1.5–6.5)
NEUT%: 45.2 % (ref 39.0–75.0)
PLATELETS: 180 10*3/uL (ref 140–400)
RBC: 3.29 10*6/uL — ABNORMAL LOW (ref 4.20–5.82)
RDW: 18.7 % — ABNORMAL HIGH (ref 11.0–14.6)
WBC: 2.6 10*3/uL — ABNORMAL LOW (ref 4.0–10.3)

## 2017-04-24 LAB — COMPREHENSIVE METABOLIC PANEL
ALBUMIN: 3.3 g/dL — AB (ref 3.5–5.0)
ALK PHOS: 63 U/L (ref 40–150)
ALT: 13 U/L (ref 0–55)
AST: 18 U/L (ref 5–34)
Anion Gap: 9 mEq/L (ref 3–11)
BUN: 17.7 mg/dL (ref 7.0–26.0)
CALCIUM: 9.9 mg/dL (ref 8.4–10.4)
CO2: 28 mEq/L (ref 22–29)
CREATININE: 1.1 mg/dL (ref 0.7–1.3)
Chloride: 102 mEq/L (ref 98–109)
EGFR: 75 mL/min/{1.73_m2} — ABNORMAL LOW (ref >90)
Glucose: 171 mg/dl — ABNORMAL HIGH (ref 70–140)
Potassium: 3.6 mEq/L (ref 3.5–5.1)
Sodium: 139 mEq/L (ref 136–145)
Total Bilirubin: 0.72 mg/dL (ref 0.20–1.20)
Total Protein: 6.6 g/dL (ref 6.4–8.3)

## 2017-05-02 ENCOUNTER — Other Ambulatory Visit (HOSPITAL_BASED_OUTPATIENT_CLINIC_OR_DEPARTMENT_OTHER): Payer: Medicare HMO

## 2017-05-02 ENCOUNTER — Encounter: Payer: Self-pay | Admitting: *Deleted

## 2017-05-02 DIAGNOSIS — C3411 Malignant neoplasm of upper lobe, right bronchus or lung: Secondary | ICD-10-CM | POA: Diagnosis not present

## 2017-05-02 DIAGNOSIS — C7951 Secondary malignant neoplasm of bone: Secondary | ICD-10-CM

## 2017-05-02 DIAGNOSIS — C3491 Malignant neoplasm of unspecified part of right bronchus or lung: Secondary | ICD-10-CM

## 2017-05-02 LAB — COMPREHENSIVE METABOLIC PANEL
ALBUMIN: 3.3 g/dL — AB (ref 3.5–5.0)
ALK PHOS: 63 U/L (ref 40–150)
ALT: 10 U/L (ref 0–55)
AST: 16 U/L (ref 5–34)
Anion Gap: 8 mEq/L (ref 3–11)
BILIRUBIN TOTAL: 0.33 mg/dL (ref 0.20–1.20)
BUN: 12.9 mg/dL (ref 7.0–26.0)
CO2: 29 meq/L (ref 22–29)
CREATININE: 1 mg/dL (ref 0.7–1.3)
Calcium: 10.1 mg/dL (ref 8.4–10.4)
Chloride: 104 mEq/L (ref 98–109)
GLUCOSE: 105 mg/dL (ref 70–140)
Potassium: 3.7 mEq/L (ref 3.5–5.1)
SODIUM: 141 meq/L (ref 136–145)
TOTAL PROTEIN: 6.8 g/dL (ref 6.4–8.3)

## 2017-05-02 LAB — CBC WITH DIFFERENTIAL/PLATELET
BASO%: 0.9 % (ref 0.0–2.0)
Basophils Absolute: 0 10*3/uL (ref 0.0–0.1)
EOS ABS: 0.2 10*3/uL (ref 0.0–0.5)
EOS%: 4.1 % (ref 0.0–7.0)
HCT: 29.4 % — ABNORMAL LOW (ref 38.4–49.9)
HEMOGLOBIN: 9.4 g/dL — AB (ref 13.0–17.1)
LYMPH%: 24.6 % (ref 14.0–49.0)
MCH: 28.3 pg (ref 27.2–33.4)
MCHC: 32 g/dL (ref 32.0–36.0)
MCV: 88.4 fL (ref 79.3–98.0)
MONO#: 0.5 10*3/uL (ref 0.1–0.9)
MONO%: 13.6 % (ref 0.0–14.0)
NEUT#: 2.2 10*3/uL (ref 1.5–6.5)
NEUT%: 56.8 % (ref 39.0–75.0)
Platelets: 169 10*3/uL (ref 140–400)
RBC: 3.33 10*6/uL — AB (ref 4.20–5.82)
RDW: 20.4 % — AB (ref 11.0–14.6)
WBC: 3.8 10*3/uL — AB (ref 4.0–10.3)
lymph#: 0.9 10*3/uL (ref 0.9–3.3)

## 2017-05-08 ENCOUNTER — Ambulatory Visit (HOSPITAL_BASED_OUTPATIENT_CLINIC_OR_DEPARTMENT_OTHER): Payer: Medicare HMO | Admitting: Internal Medicine

## 2017-05-08 ENCOUNTER — Ambulatory Visit (HOSPITAL_BASED_OUTPATIENT_CLINIC_OR_DEPARTMENT_OTHER): Payer: Medicare HMO

## 2017-05-08 ENCOUNTER — Encounter: Payer: Self-pay | Admitting: Internal Medicine

## 2017-05-08 ENCOUNTER — Other Ambulatory Visit (HOSPITAL_BASED_OUTPATIENT_CLINIC_OR_DEPARTMENT_OTHER): Payer: Medicare HMO

## 2017-05-08 ENCOUNTER — Telehealth: Payer: Self-pay | Admitting: Internal Medicine

## 2017-05-08 VITALS — BP 125/76 | HR 83 | Temp 98.6°F | Resp 18 | Ht 71.0 in | Wt 186.0 lb

## 2017-05-08 DIAGNOSIS — C7971 Secondary malignant neoplasm of right adrenal gland: Secondary | ICD-10-CM | POA: Diagnosis not present

## 2017-05-08 DIAGNOSIS — N400 Enlarged prostate without lower urinary tract symptoms: Secondary | ICD-10-CM | POA: Diagnosis not present

## 2017-05-08 DIAGNOSIS — C3411 Malignant neoplasm of upper lobe, right bronchus or lung: Secondary | ICD-10-CM | POA: Diagnosis not present

## 2017-05-08 DIAGNOSIS — Z5111 Encounter for antineoplastic chemotherapy: Secondary | ICD-10-CM | POA: Diagnosis not present

## 2017-05-08 DIAGNOSIS — C3491 Malignant neoplasm of unspecified part of right bronchus or lung: Secondary | ICD-10-CM

## 2017-05-08 DIAGNOSIS — C7972 Secondary malignant neoplasm of left adrenal gland: Secondary | ICD-10-CM

## 2017-05-08 DIAGNOSIS — R300 Dysuria: Secondary | ICD-10-CM

## 2017-05-08 DIAGNOSIS — C7951 Secondary malignant neoplasm of bone: Secondary | ICD-10-CM

## 2017-05-08 LAB — COMPREHENSIVE METABOLIC PANEL
ALT: 11 U/L (ref 0–55)
ANION GAP: 10 meq/L (ref 3–11)
AST: 17 U/L (ref 5–34)
Albumin: 3.3 g/dL — ABNORMAL LOW (ref 3.5–5.0)
Alkaline Phosphatase: 64 U/L (ref 40–150)
BUN: 10.9 mg/dL (ref 7.0–26.0)
CALCIUM: 10 mg/dL (ref 8.4–10.4)
CHLORIDE: 106 meq/L (ref 98–109)
CO2: 26 meq/L (ref 22–29)
CREATININE: 1 mg/dL (ref 0.7–1.3)
Glucose: 141 mg/dl — ABNORMAL HIGH (ref 70–140)
Potassium: 3.6 mEq/L (ref 3.5–5.1)
Sodium: 141 mEq/L (ref 136–145)
Total Bilirubin: 0.3 mg/dL (ref 0.20–1.20)
Total Protein: 7 g/dL (ref 6.4–8.3)

## 2017-05-08 LAB — CBC WITH DIFFERENTIAL/PLATELET
BASO%: 0.9 % (ref 0.0–2.0)
BASOS ABS: 0 10*3/uL (ref 0.0–0.1)
EOS ABS: 0.2 10*3/uL (ref 0.0–0.5)
EOS%: 3.2 % (ref 0.0–7.0)
HEMATOCRIT: 31.1 % — AB (ref 38.4–49.9)
HGB: 10.1 g/dL — ABNORMAL LOW (ref 13.0–17.1)
LYMPH#: 1.2 10*3/uL (ref 0.9–3.3)
LYMPH%: 23.6 % (ref 14.0–49.0)
MCH: 29.1 pg (ref 27.2–33.4)
MCHC: 32.5 g/dL (ref 32.0–36.0)
MCV: 89.5 fL (ref 79.3–98.0)
MONO#: 0.6 10*3/uL (ref 0.1–0.9)
MONO%: 13.1 % (ref 0.0–14.0)
NEUT#: 2.9 10*3/uL (ref 1.5–6.5)
NEUT%: 59.2 % (ref 39.0–75.0)
PLATELETS: 382 10*3/uL (ref 140–400)
RBC: 3.48 10*6/uL — AB (ref 4.20–5.82)
RDW: 21.5 % — ABNORMAL HIGH (ref 11.0–14.6)
WBC: 4.9 10*3/uL (ref 4.0–10.3)

## 2017-05-08 MED ORDER — SODIUM CHLORIDE 0.9 % IV SOLN
544.5000 mg | Freq: Once | INTRAVENOUS | Status: AC
  Administered 2017-05-08: 540 mg via INTRAVENOUS
  Filled 2017-05-08: qty 54

## 2017-05-08 MED ORDER — SODIUM CHLORIDE 0.9 % IV SOLN
Freq: Once | INTRAVENOUS | Status: AC
  Administered 2017-05-08: 12:00:00 via INTRAVENOUS

## 2017-05-08 MED ORDER — SODIUM CHLORIDE 0.9 % IV SOLN
480.0000 mg/m2 | Freq: Once | INTRAVENOUS | Status: AC
  Administered 2017-05-08: 1000 mg via INTRAVENOUS
  Filled 2017-05-08: qty 40

## 2017-05-08 MED ORDER — DEXAMETHASONE SODIUM PHOSPHATE 10 MG/ML IJ SOLN
10.0000 mg | Freq: Once | INTRAMUSCULAR | Status: AC
  Administered 2017-05-08: 10 mg via INTRAVENOUS

## 2017-05-08 MED ORDER — CYANOCOBALAMIN 1000 MCG/ML IJ SOLN
1000.0000 ug | Freq: Once | INTRAMUSCULAR | Status: AC
  Administered 2017-05-08: 1000 ug via INTRAMUSCULAR

## 2017-05-08 MED ORDER — PALONOSETRON HCL INJECTION 0.25 MG/5ML
0.2500 mg | Freq: Once | INTRAVENOUS | Status: AC
  Administered 2017-05-08: 0.25 mg via INTRAVENOUS

## 2017-05-08 MED ORDER — CYANOCOBALAMIN 1000 MCG/ML IJ SOLN
INTRAMUSCULAR | Status: AC
  Filled 2017-05-08: qty 1

## 2017-05-08 MED ORDER — PALONOSETRON HCL INJECTION 0.25 MG/5ML
INTRAVENOUS | Status: AC
  Filled 2017-05-08: qty 5

## 2017-05-08 MED ORDER — DEXAMETHASONE SODIUM PHOSPHATE 10 MG/ML IJ SOLN
INTRAMUSCULAR | Status: AC
Start: 2017-05-08 — End: 2017-05-08
  Filled 2017-05-08: qty 1

## 2017-05-08 NOTE — Progress Notes (Signed)
[place] [phone]   [phone]  OFFICE PROGRESS NOTE  [name], MD [address] [place] [address]  DIAGNOSIS: Stage IV (T3, N3, M1b) non-small cell lung cancer, adenocarcinoma with negative EGFR, ALK, ROS 1 and BRAF mutations as well as negative PDL 1 expression diagnosed in February 2018 and presented with large right upper lobe lung mass with mediastinal and left cervical lymphadenopathy as well as metastatic disease to the adrenal glands and bones.  PRIOR THERAPY: None.  CURRENT THERAPY: Systemic chemotherapy with carboplatin for AUC of 5 and Alimta 500 MG/M2 every 3 weeks. Status post 3 cycles.  INTERVAL HISTORY: Jim [name] 71 y.o. male returns to the clinic today for follow-up visit. The patient is feeling fine today with no specific complaints except for fatigue. He has been tolerating his systemic chemotherapy was carboplatin and Alimta fairly well. He denied having any chest pain but continues to have shortness breath with exertion with mild cough and no hemoptysis. He has no significant weight loss or night sweats. He has no nausea, vomiting, diarrhea or constipation. He has no fever or chills. He had repeat CT scan of the chest, abdomen and pelvis performed recently and he is here for evaluation and discussion of his scan results.  MEDICAL HISTORY: Past Medical History:  Diagnosis Date  . Adenocarcinoma of right lung, stage 4 ([facility]) 01/03/2017  . Alcohol abuse 02/06/2011   with alcohol withdrawal during hospital stay  . Angioedema 02/06/2011   lisinopril  . Cardiomyopathy 02/06/2011   Cardiomyopathy with systolic heart failure, newly diagnosed  . Confusion 02/06/2011   possibly secondary to cerebrovascular accident  versus dementia  . CVA (cerebral infarction) 02/06/2011   Ischemic acute infarct in right anterior cerebral artery territory  . Diabetes mellitus   . Encounter for antineoplastic  chemotherapy 01/03/2017  . Goals of care, counseling/discussion 01/03/2017  . Hypertension    Mild LVH per echo  . Kidney disease, chronic, stage III (GFR 30-59 ml/min)   . Left bundle branch block 02/06/2011  . Marijuana abuse 02/06/2011  . Nonischemic cardiomyopathy (HCC)    EF is 30 to 35%  . Periodontal disease   . Periodontal disease    extensive  . Tobacco abuse     ALLERGIES:  is allergic to metformin and related and lisinopril.  MEDICATIONS:  Current Outpatient Prescriptions  Medication Sig Dispense Refill  . albuterol (PROVENTIL HFA;VENTOLIN HFA) 108 (90 Base) MCG/ACT inhaler Inhale 1 puff into the lungs every 6 (six) hours as needed.    Marland Kitchen aspirin 81 MG tablet Take 81 mg by mouth daily.      Marland Kitchen buPROPion (WELLBUTRIN SR) 100 MG 12 hr tablet Take 100 mg by mouth 2 (two) times daily.    . carvedilol (COREG) 12.5 MG tablet Take 1 tablet by mouth daily.    . ciprofloxacin (CIPRO) 500 MG tablet Take 500 mg by mouth 2 (two) times daily.    . clopidogrel (PLAVIX) 75 MG tablet Take 75 mg by mouth daily with breakfast.    . dexamethasone (DECADRON) 4 MG tablet 4 mg by mouth twice a day the day before, day of and day after the chemotherapy every 3 weeks 40 tablet 1  . diphenhydrAMINE (BENADRYL) 25 MG tablet Take 1 tablet (25 mg total) by mouth every 6 (six) hours. 20 tablet 0  . famotidine (PEPCID) 20 MG tablet Take 1 tablet (20 mg total) by mouth 2 (two) times  daily. 10 tablet 0  . folic acid (FOLVITE) 1 MG tablet Take 1 tablet (1 mg total) by mouth daily. 30 tablet 4  . hydrALAZINE (APRESOLINE) 25 MG tablet Take 1 tablet (25 mg total) by mouth 2 (two) times daily. 120 tablet 11  . hydrochlorothiazide (HYDRODIURIL) 25 MG tablet Take 25 mg by mouth daily.    . Insulin Glargine (LANTUS) 100 UNIT/ML Solostar Pen Inject 14 Units into the skin 2 (two) times daily. Inject 28 units daily    . isosorbide mononitrate (IMDUR) 30 MG 24 hr tablet Take 1 tablet (30 mg total) by mouth daily. 30 tablet 11    . isosorbide mononitrate (IMDUR) 30 MG 24 hr tablet TAKE 1 TABLET BY MOUTH DAILY    . multivitamin (THERAGRAN) per tablet Take 1 tablet by mouth daily.      . nitroGLYCERIN (NITROSTAT) 0.4 MG SL tablet Place 0.4 mg under the tongue every 5 (five) minutes as needed for chest pain.     . phenazopyridine (PYRIDIUM) 200 MG tablet Take 1 tablet (200 mg total) by mouth 3 (three) times daily as needed for pain. 30 tablet 0  . prochlorperazine (COMPAZINE) 10 MG tablet Take 1 tablet (10 mg total) by mouth every 6 (six) hours as needed for nausea or vomiting. 30 tablet 0  . rosuvastatin (CRESTOR) 10 MG tablet Take 10 mg by mouth daily.      . tamsulosin (FLOMAX) 0.4 MG CAPS capsule     . traMADol (ULTRAM) 50 MG tablet Take 50 mg by mouth every 8 (eight) hours as needed for moderate pain.    . traZODone (DESYREL) 50 MG tablet Take 50 mg by mouth at bedtime. Take 1/2 to 1 tablet hs     No current facility-administered medications for this visit.     SURGICAL HISTORY:  Past Surgical History:  Procedure Laterality Date  . US ECHOCARDIOGRAPHY  April 2012   EF 30 to 35 %    REVIEW OF SYSTEMS:  Constitutional: positive for fatigue Eyes: negative Ears, nose, mouth, throat, and face: negative Respiratory: positive for dyspnea on exertion Cardiovascular: negative Gastrointestinal: negative Genitourinary:negative Integument/breast: negative Hematologic/lymphatic: negative Musculoskeletal:positive for muscle weakness Neurological: negative Behavioral/Psych: negative Endocrine: negative Allergic/Immunologic: negative   PHYSICAL EXAMINATION: General appearance: alert, cooperative, fatigued and no distress Head: Normocephalic, without obvious abnormality, atraumatic Neck: no adenopathy, no JVD, supple, symmetrical, trachea midline and thyroid not enlarged, symmetric, no tenderness/mass/nodules Lymph nodes: Cervical, supraclavicular, and axillary nodes normal. Resp: clear to auscultation  bilaterally Back: symmetric, no curvature. ROM normal. No CVA tenderness. Cardio: regular rate and rhythm, S1, S2 normal, no murmur, click, rub or gallop GI: soft, non-tender; bowel sounds normal; no masses,  no organomegaly Extremities: extremities normal, atraumatic, no cyanosis or edema Neurologic: Alert and oriented X 3, normal strength and tone. Normal symmetric reflexes. Normal coordination and gait  ECOG PERFORMANCE STATUS: 1 - Symptomatic but completely ambulatory  Blood pressure 125/76, pulse 83, temperature 98.6 F (37 C), temperature source Oral, resp. rate 18, height 5\' 11"  (1.803 m), weight 186 lb (84.4 kg), SpO2 100 %.  LABORATORY DATA: Lab Results  Component Value Date   WBC 4.9 05/08/2017   HGB 10.1 (L) 05/08/2017   HCT 31.1 (L) 05/08/2017   MCV 89.5 05/08/2017   PLT 382 05/08/2017      Chemistry      Component Value Date/Time   NA 141 05/02/2017 1237   K 3.7 05/02/2017 1237   CL 99 11/06/2013 1940   CO2 29  05/02/2017 1237   BUN 12.9 05/02/2017 1237   CREATININE 1.0 05/02/2017 1237      Component Value Date/Time   CALCIUM 10.1 05/02/2017 1237   ALKPHOS 63 05/02/2017 1237   AST 16 05/02/2017 1237   ALT 10 05/02/2017 1237   BILITOT 0.33 05/02/2017 1237       RADIOGRAPHIC STUDIES: No results found.  ASSESSMENT AND PLAN:  This is a very pleasant 71 years old African-American male with stage IV non-small cell lung cancer, adenocarcinoma with no actionable mutations and currently undergoing systemic chemotherapy was carboplatin and Alimta status post 3 cycles. The patient has been tolerating his treatment with systemic chemotherapy fairly well with no significant adverse effects except for fatigue. He had repeat CT scan of the chest, abdomen and pelvis performed recently. I personally and independently reviewed the scan images and discuss the results with the patient today. PET scan showed improvement of his disease was decrease in the size of the large  masslike consolidation involving the right lung as well as decrease in the mediastinal lymphadenopathy. I recommended for the patient to continue his current treatment with systemic chemotherapy with carboplatin and Alimta and he will proceed with cycle #4 today. I will see him back for follow-up visit in 3 weeks for evaluation before starting cycle #5. For hypertension, the patient will continue his current treatment with Coreg and hydrochlorothiazide. For pain management, he will continue on tramadol. The patient was advised to call immediately if he has any concerning symptoms in the interval. The patient voices understanding of current disease status and treatment options and is in agreement with the current care plan. All questions were answered. The patient knows to call the clinic with any problems, questions or concerns. We can certainly see the patient much sooner if necessary.  Disclaimer: This note was dictated with voice recognition software. Similar sounding words can inadvertently be transcribed and may not be corrected upon review.            [place] [phone]   [phone]) [phone]  OFFICE PROGRESS NOTE  [name], MD [address] [place] [address]  DIAGNOSIS: Stage IV (T3, N3, M1b) non-small cell lung cancer, adenocarcinoma with negative EGFR, ALK, ROS 1 and BRAF mutations as well as negative PDL 1 expression diagnosed in February 2018 and presented with large right upper lobe lung mass with mediastinal and left cervical lymphadenopathy as well as metastatic disease to the adrenal glands and bones.  PRIOR THERAPY: None.  CURRENT THERAPY: Systemic chemotherapy with carboplatin for AUC of 5 and Alimta 500 MG/M2 every 3 weeks. Status post 5 cycles.  INTERVAL HISTORY: Jim [name] 71 y.o. male returns to the clinic today for follow-up visit. The patient is feeling fine today with no specific complaints except  for increased frequency of urination and he was seen by his primary care physician yesterday and was given prescription for Flomax. She also referred him to orthopedic surgery because of left knee arthritis. He tolerated the last cycle of his treatment fairly well with no significant adverse effects. He denied having any weight loss or night sweats. He has no nausea, vomiting, diarrhea or constipation. He has no fever or chills. He denied having any chest pain but continues to have shortness of breath with exertion was no cough or hemoptysis. He is here today for evaluation before starting cycle #6 of his treatment.   MEDICAL HISTORY: Past Medical History:  Diagnosis Date  .  Adenocarcinoma of right lung, stage 4 ([facility]) 01/03/2017  . Alcohol abuse 02/06/2011   with alcohol withdrawal during hospital stay  . Angioedema 02/06/2011   lisinopril  . Cardiomyopathy 02/06/2011   Cardiomyopathy with systolic heart failure, newly diagnosed  . Confusion 02/06/2011   possibly secondary to cerebrovascular accident  versus dementia  . CVA (cerebral infarction) 02/06/2011   Ischemic acute infarct in right anterior cerebral artery territory  . Diabetes mellitus   . Encounter for antineoplastic chemotherapy 01/03/2017  . Goals of care, counseling/discussion 01/03/2017  . Hypertension    Mild LVH per echo  . Kidney disease, chronic, stage III (GFR 30-59 ml/min)   . Left bundle branch block 02/06/2011  . Marijuana abuse 02/06/2011  . Nonischemic cardiomyopathy ([facility])    EF is 30 to 35%  . Periodontal disease   . Periodontal disease    extensive  . Tobacco abuse     ALLERGIES:  is allergic to metformin and related and lisinopril.  MEDICATIONS:  Current Outpatient Prescriptions  Medication Sig Dispense Refill  . albuterol (PROVENTIL HFA;VENTOLIN HFA) 108 (90 Base) MCG/ACT inhaler Inhale 1 puff into the lungs every 6 (six) hours as needed.    Marland Kitchen aspirin 81 MG tablet Take 81 mg by mouth daily.      Marland Kitchen buPROPion  (WELLBUTRIN SR) 100 MG 12 hr tablet Take 100 mg by mouth 2 (two) times daily.    . carvedilol (COREG) 12.5 MG tablet Take 1 tablet by mouth daily.    . ciprofloxacin (CIPRO) 500 MG tablet Take 500 mg by mouth 2 (two) times daily.    . clopidogrel (PLAVIX) 75 MG tablet Take 75 mg by mouth daily with breakfast.    . dexamethasone (DECADRON) 4 MG tablet 4 mg by mouth twice a day the day before, day of and day after the chemotherapy every 3 weeks 40 tablet 1  . diphenhydrAMINE (BENADRYL) 25 MG tablet Take 1 tablet (25 mg total) by mouth every 6 (six) hours. 20 tablet 0  . famotidine (PEPCID) 20 MG tablet Take 1 tablet (20 mg total) by mouth 2 (two) times daily. 10 tablet 0  . folic acid (FOLVITE) 1 MG tablet Take 1 tablet (1 mg total) by mouth daily. 30 tablet 4  . hydrALAZINE (APRESOLINE) 25 MG tablet Take 1 tablet (25 mg total) by mouth 2 (two) times daily. 120 tablet 11  . hydrochlorothiazide (HYDRODIURIL) 25 MG tablet Take 25 mg by mouth daily.    . Insulin Glargine (LANTUS) 100 UNIT/ML Solostar Pen Inject 14 Units into the skin 2 (two) times daily. Inject 28 units daily    . isosorbide mononitrate (IMDUR) 30 MG 24 hr tablet Take 1 tablet (30 mg total) by mouth daily. 30 tablet 11  . isosorbide mononitrate (IMDUR) 30 MG 24 hr tablet TAKE 1 TABLET BY MOUTH DAILY    . multivitamin (THERAGRAN) per tablet Take 1 tablet by mouth daily.      . nitroGLYCERIN (NITROSTAT) 0.4 MG SL tablet Place 0.4 mg under the tongue every 5 (five) minutes as needed for chest pain.     . phenazopyridine (PYRIDIUM) 200 MG tablet Take 1 tablet (200 mg total) by mouth 3 (three) times daily as needed for pain. 30 tablet 0  . prochlorperazine (COMPAZINE) 10 MG tablet Take 1 tablet (10 mg total) by mouth every 6 (six) hours as needed for nausea or vomiting. 30 tablet 0  . rosuvastatin (CRESTOR) 10 MG tablet Take 10 mg by mouth daily.      Marland Kitchen  tamsulosin (FLOMAX) 0.4 MG CAPS capsule     . traMADol (ULTRAM) 50 MG tablet Take 50 mg  by mouth every 8 (eight) hours as needed for moderate pain.    . traZODone (DESYREL) 50 MG tablet Take 50 mg by mouth at bedtime. Take 1/2 to 1 tablet hs     No current facility-administered medications for this visit.     SURGICAL HISTORY:  Past Surgical History:  Procedure Laterality Date  . US ECHOCARDIOGRAPHY  April 2012   EF 30 to 35 %    REVIEW OF SYSTEMS:  A comprehensive review of systems was negative except for: Constitutional: positive for fatigue Genitourinary: positive for dysuria and frequency Musculoskeletal: positive for arthralgias and muscle weakness   PHYSICAL EXAMINATION: General appearance: alert, cooperative, fatigued and no distress Head: Normocephalic, without obvious abnormality, atraumatic Neck: no adenopathy, no JVD, supple, symmetrical, trachea midline and thyroid not enlarged, symmetric, no tenderness/mass/nodules Lymph nodes: Cervical, supraclavicular, and axillary nodes normal. Resp: clear to auscultation bilaterally Back: symmetric, no curvature. ROM normal. No CVA tenderness. Cardio: regular rate and rhythm, S1, S2 normal, no murmur, click, rub or gallop GI: soft, non-tender; bowel sounds normal; no masses,  no organomegaly Extremities: extremities normal, atraumatic, no cyanosis or edema  ECOG PERFORMANCE STATUS: 1 - Symptomatic but completely ambulatory  Blood pressure 125/76, pulse 83, temperature 98.6 F (37 C), temperature source Oral, resp. rate 18, height '5\' 11"'$  (1.803 m), weight 186 lb (84.4 kg), SpO2 100 %.  LABORATORY DATA: Lab Results  Component Value Date   WBC 4.9 05/08/2017   HGB 10.1 (L) 05/08/2017   HCT 31.1 (L) 05/08/2017   MCV 89.5 05/08/2017   PLT 382 05/08/2017      Chemistry      Component Value Date/Time   NA 141 05/02/2017 1237   K 3.7 05/02/2017 1237   CL 99 11/06/2013 1940   CO2 29 05/02/2017 1237   BUN 12.9 05/02/2017 1237   CREATININE 1.0 05/02/2017 1237      Component Value Date/Time   CALCIUM 10.1  05/02/2017 1237   ALKPHOS 63 05/02/2017 1237   AST 16 05/02/2017 1237   ALT 10 05/02/2017 1237   BILITOT 0.33 05/02/2017 1237       RADIOGRAPHIC STUDIES: No results found.  ASSESSMENT AND PLAN:  This is a very pleasant 71 years old African-American male with stage IV non-small cell lung cancer, adenocarcinoma was no actionable mutations and negative PDL 1 expression. The patient is currently on systemic chemotherapy with carboplatin, and Alimta every 3 weeks is status post 5 cycles. He tolerated the last cycle of his treatment well. I recommended for the patient to proceed with cycle #6 today as scheduled. I will see him back for follow-up visit and 4 weeks for evaluation after repeating CT scan of the chest, abdomen and pelvis for restaging of his disease. For the benign prostatic hypertrophy, he he will start treatment with Flomax per his primary care physician. He was advised to call immediately if he has any concerning symptoms in the interval. The patient voices understanding of current disease status and treatment options and is in agreement with the current care plan. All questions were answered. The patient knows to call the clinic with any problems, questions or concerns. We can certainly see the patient much sooner if necessary. I spent 10 minutes counseling the patient face to face. The total time spent in the appointment was 15 minutes.  Disclaimer: This note was dictated with voice recognition software.  Similar sounding words can inadvertently be transcribed and may not be corrected upon review.

## 2017-05-08 NOTE — Telephone Encounter (Signed)
Scheduled appt per 7/11 los - Gave patient AVS and calender per los. [facility] to contact patient with ct schedule.

## 2017-05-08 NOTE — Patient Instructions (Signed)
[name] Discharge Instructions for Patients Receiving Chemotherapy  Today you received the following chemotherapy agents Alimta/Carboplatin  To help prevent nausea and vomiting after your treatment, we encourage you to take your nausea medication as prescribed.    If you develop nausea and vomiting that is not controlled by your nausea medication, call the clinic.   BELOW ARE SYMPTOMS THAT SHOULD BE REPORTED IMMEDIATELY:  *FEVER GREATER THAN 100.5 F  *CHILLS WITH OR WITHOUT FEVER  NAUSEA AND VOMITING THAT IS NOT CONTROLLED WITH YOUR NAUSEA MEDICATION  *UNUSUAL SHORTNESS OF BREATH  *UNUSUAL BRUISING OR BLEEDING  TENDERNESS IN MOUTH AND THROAT WITH OR WITHOUT PRESENCE OF ULCERS  *URINARY PROBLEMS  *BOWEL PROBLEMS  UNUSUAL RASH Items with * indicate a potential emergency and should be followed up as soon as possible.  Feel free to call the clinic you have any questions or concerns. The clinic phone number is [phone].  Please show the [facility] at check-in to the Emergency Department and triage nurse.   Pemetrexed injection (Alimta) What is this medicine? PEMETREXED (PEM e TREX ed) is a chemotherapy drug used to treat lung cancers like non-small cell lung cancer and mesothelioma. It may also be used to treat other cancers. This medicine may be used for other purposes; ask your health care provider or pharmacist if you have questions. COMMON BRAND NAME(S): Alimta What should I tell my health care provider before I take this medicine? They need to know if you have any of these conditions: -infection (especially a virus infection such as chickenpox, cold sores, or herpes) -kidney disease -low blood counts, like low white cell, platelet, or red cell counts -lung or breathing disease, like asthma -radiation therapy -an unusual or allergic reaction to pemetrexed, other medicines, foods, dyes, or preservative -pregnant or trying to get  pregnant -breast-feeding How should I use this medicine? This drug is given as an infusion into a vein. It is administered in a hospital or clinic by a specially trained health care professional. Talk to your pediatrician regarding the use of this medicine in children. Special care may be needed. Overdosage: If you think you have taken too much of this medicine contact a poison control center or emergency room at once. NOTE: This medicine is only for you. Do not share this medicine with others. What if I miss a dose? It is important not to miss your dose. Call your doctor or health care professional if you are unable to keep an appointment. What may interact with this medicine? This medicine may interact with the following medications: -Ibuprofen This list may not describe all possible interactions. Give your health care provider a list of all the medicines, herbs, non-prescription drugs, or dietary supplements you use. Also tell them if you smoke, drink alcohol, or use illegal drugs. Some items may interact with your medicine. What should I watch for while using this medicine? Visit your doctor for checks on your progress. This drug may make you feel generally unwell. This is not uncommon, as chemotherapy can affect healthy cells as well as cancer cells. Report any side effects. Continue your course of treatment even though you feel ill unless your doctor tells you to stop. In some cases, you may be given additional medicines to help with side effects. Follow all directions for their use. Call your doctor or health care professional for advice if you get a fever, chills or sore throat, or other symptoms of a cold or flu. Do not  treat yourself. This drug decreases your body's ability to fight infections. Try to avoid being around people who are sick. This medicine may increase your risk to bruise or bleed. Call your doctor or health care professional if you notice any unusual bleeding. Be careful  brushing and flossing your teeth or using a toothpick because you may get an infection or bleed more easily. If you have any dental work done, tell your dentist you are receiving this medicine. Avoid taking products that contain aspirin, acetaminophen, ibuprofen, naproxen, or ketoprofen unless instructed by your doctor. These medicines may hide a fever. Call your doctor or health care professional if you get diarrhea or mouth sores. Do not treat yourself. To protect your kidneys, drink water or other fluids as directed while you are taking this medicine. Do not become pregnant while taking this medicine or for 6 months after stopping it. Women should inform their doctor if they wish to become pregnant or think they might be pregnant. Men should not father a child while taking this medicine and for 3 months after stopping it. This may interfere with the ability to father a child. You should talk to your doctor or health care professional if you are concerned about your fertility. There is a potential for serious side effects to an unborn child. Talk to your health care professional or pharmacist for more information. Do not breast-feed an infant while taking this medicine or for 1 week after stopping it. What side effects may I notice from receiving this medicine? Side effects that you should report to your doctor or health care professional as soon as possible: -allergic reactions like skin rash, itching or hives, swelling of the face, lips, or tongue -breathing problems -redness, blistering, peeling or loosening of the skin, including inside the mouth -signs and symptoms of bleeding such as bloody or black, tarry stools; red or dark-brown urine; spitting up blood or brown material that looks like coffee grounds; red spots on the skin; unusual bruising or bleeding from the eye, gums, or nose -signs and symptoms of infection like fever or chills; cough; sore throat; pain or trouble passing urine -signs  and symptoms of kidney injury like trouble passing urine or change in the amount of urine -signs and symptoms of liver injury like dark yellow or brown urine; general ill feeling or flu-like symptoms; light-colored stools; loss of appetite; nausea; right upper belly pain; unusually weak or tired; yellowing of the eyes or skin Side effects that usually do not require medical attention (report to your doctor or health care professional if they continue or are bothersome): -constipation -dizziness -mouth sores -nausea, vomiting -pain, tingling, numbness in the hands or feet -unusually weak or tired This list may not describe all possible side effects. Call your doctor for medical advice about side effects. You may report side effects to FDA at 1-800-FDA-1088. Where should I keep my medicine? This drug is given in a hospital or clinic and will not be stored at home. NOTE: This sheet is a summary. It may not cover all possible information. If you have questions about this medicine, talk to your doctor, pharmacist, or health care provider.  2018 [employer] Standard (2016-08-14 18:51:46)   Carboplatin injection What is this medicine? CARBOPLATIN (KAR boe pla tin) is a chemotherapy drug. It targets fast dividing cells, like cancer cells, and causes these cells to die. This medicine is used to treat ovarian cancer and many other cancers. This medicine may be used for other purposes; ask  your health care provider or pharmacist if you have questions. COMMON BRAND NAME(S): Paraplatin What should I tell my health care provider before I take this medicine? They need to know if you have any of these conditions: -blood disorders -hearing problems -kidney disease -recent or ongoing radiation therapy -an unusual or allergic reaction to carboplatin, cisplatin, other chemotherapy, other medicines, foods, dyes, or preservatives -pregnant or trying to get pregnant -breast-feeding How should I use this  medicine? This drug is usually given as an infusion into a vein. It is administered in a hospital or clinic by a specially trained health care professional. Talk to your pediatrician regarding the use of this medicine in children. Special care may be needed. Overdosage: If you think you have taken too much of this medicine contact a poison control center or emergency room at once. NOTE: This medicine is only for you. Do not share this medicine with others. What if I miss a dose? It is important not to miss a dose. Call your doctor or health care professional if you are unable to keep an appointment. What may interact with this medicine? -medicines for seizures -medicines to increase blood counts like filgrastim, pegfilgrastim, sargramostim -some antibiotics like amikacin, gentamicin, neomycin, streptomycin, tobramycin -vaccines Talk to your doctor or health care professional before taking any of these medicines: -acetaminophen -aspirin -ibuprofen -ketoprofen -naproxen This list may not describe all possible interactions. Give your health care provider a list of all the medicines, herbs, non-prescription drugs, or dietary supplements you use. Also tell them if you smoke, drink alcohol, or use illegal drugs. Some items may interact with your medicine. What should I watch for while using this medicine? Your condition will be monitored carefully while you are receiving this medicine. You will need important blood work done while you are taking this medicine. This drug may make you feel generally unwell. This is not uncommon, as chemotherapy can affect healthy cells as well as cancer cells. Report any side effects. Continue your course of treatment even though you feel ill unless your doctor tells you to stop. In some cases, you may be given additional medicines to help with side effects. Follow all directions for their use. Call your doctor or health care professional for advice if you get a  fever, chills or sore throat, or other symptoms of a cold or flu. Do not treat yourself. This drug decreases your body's ability to fight infections. Try to avoid being around people who are sick. This medicine may increase your risk to bruise or bleed. Call your doctor or health care professional if you notice any unusual bleeding. Be careful brushing and flossing your teeth or using a toothpick because you may get an infection or bleed more easily. If you have any dental work done, tell your dentist you are receiving this medicine. Avoid taking products that contain aspirin, acetaminophen, ibuprofen, naproxen, or ketoprofen unless instructed by your doctor. These medicines may hide a fever. Do not become pregnant while taking this medicine. Women should inform their doctor if they wish to become pregnant or think they might be pregnant. There is a potential for serious side effects to an unborn child. Talk to your health care professional or pharmacist for more information. Do not breast-feed an infant while taking this medicine. What side effects may I notice from receiving this medicine? Side effects that you should report to your doctor or health care professional as soon as possible: -allergic reactions like skin rash, itching or hives, swelling  of the face, lips, or tongue -signs of infection - fever or chills, cough, sore throat, pain or difficulty passing urine -signs of decreased platelets or bleeding - bruising, pinpoint red spots on the skin, black, tarry stools, nosebleeds -signs of decreased red blood cells - unusually weak or tired, fainting spells, lightheadedness -breathing problems -changes in hearing -changes in vision -chest pain -high blood pressure -low blood counts - This drug may decrease the number of white blood cells, red blood cells and platelets. You may be at increased risk for infections and bleeding. -nausea and vomiting -pain, swelling, redness or irritation at the  injection site -pain, tingling, numbness in the hands or feet -problems with balance, talking, walking -trouble passing urine or change in the amount of urine Side effects that usually do not require medical attention (report to your doctor or health care professional if they continue or are bothersome): -hair loss -loss of appetite -metallic taste in the mouth or changes in taste This list may not describe all possible side effects. Call your doctor for medical advice about side effects. You may report side effects to FDA at 1-800-FDA-1088. Where should I keep my medicine? This drug is given in a hospital or clinic and will not be stored at home. NOTE: This sheet is a summary. It may not cover all possible information. If you have questions about this medicine, talk to your doctor, pharmacist, or health care provider.  2018 [employer]/Gold Standard (2008-01-20 14:38:05)

## 2017-05-15 ENCOUNTER — Other Ambulatory Visit (HOSPITAL_BASED_OUTPATIENT_CLINIC_OR_DEPARTMENT_OTHER): Payer: Medicare HMO

## 2017-05-15 DIAGNOSIS — C3411 Malignant neoplasm of upper lobe, right bronchus or lung: Secondary | ICD-10-CM

## 2017-05-15 DIAGNOSIS — C3491 Malignant neoplasm of unspecified part of right bronchus or lung: Secondary | ICD-10-CM

## 2017-05-15 LAB — CBC WITH DIFFERENTIAL/PLATELET
BASO%: 1.2 % (ref 0.0–2.0)
Basophils Absolute: 0 10*3/uL (ref 0.0–0.1)
EOS%: 2.9 % (ref 0.0–7.0)
Eosinophils Absolute: 0.1 10*3/uL (ref 0.0–0.5)
HCT: 28.7 % — ABNORMAL LOW (ref 38.4–49.9)
HGB: 9.4 g/dL — ABNORMAL LOW (ref 13.0–17.1)
LYMPH%: 39.1 % (ref 14.0–49.0)
MCH: 29.2 pg (ref 27.2–33.4)
MCHC: 32.8 g/dL (ref 32.0–36.0)
MCV: 89.1 fL (ref 79.3–98.0)
MONO#: 0.3 10*3/uL (ref 0.1–0.9)
MONO%: 11.1 % (ref 0.0–14.0)
NEUT%: 45.7 % (ref 39.0–75.0)
NEUTROS ABS: 1.3 10*3/uL — AB (ref 1.5–6.5)
PLATELETS: 274 10*3/uL (ref 140–400)
RBC: 3.22 10*6/uL — AB (ref 4.20–5.82)
RDW: 19.8 % — ABNORMAL HIGH (ref 11.0–14.6)
WBC: 2.8 10*3/uL — AB (ref 4.0–10.3)
lymph#: 1.1 10*3/uL (ref 0.9–3.3)

## 2017-05-15 LAB — COMPREHENSIVE METABOLIC PANEL
ALT: 11 U/L (ref 0–55)
ANION GAP: 10 meq/L (ref 3–11)
AST: 17 U/L (ref 5–34)
Albumin: 3.3 g/dL — ABNORMAL LOW (ref 3.5–5.0)
Alkaline Phosphatase: 63 U/L (ref 40–150)
BILIRUBIN TOTAL: 0.67 mg/dL (ref 0.20–1.20)
BUN: 15.1 mg/dL (ref 7.0–26.0)
CHLORIDE: 98 meq/L (ref 98–109)
CO2: 28 meq/L (ref 22–29)
CREATININE: 1 mg/dL (ref 0.7–1.3)
Calcium: 9.7 mg/dL (ref 8.4–10.4)
EGFR: 84 mL/min/{1.73_m2} — ABNORMAL LOW (ref >90)
GLUCOSE: 138 mg/dL (ref 70–140)
Potassium: 3.9 mEq/L (ref 3.5–5.1)
SODIUM: 136 meq/L (ref 136–145)
TOTAL PROTEIN: 7 g/dL (ref 6.4–8.3)

## 2017-05-19 ENCOUNTER — Other Ambulatory Visit: Payer: Self-pay | Admitting: Internal Medicine

## 2017-05-22 ENCOUNTER — Other Ambulatory Visit (HOSPITAL_BASED_OUTPATIENT_CLINIC_OR_DEPARTMENT_OTHER): Payer: Medicare HMO

## 2017-05-22 DIAGNOSIS — C3491 Malignant neoplasm of unspecified part of right bronchus or lung: Secondary | ICD-10-CM

## 2017-05-22 DIAGNOSIS — C3411 Malignant neoplasm of upper lobe, right bronchus or lung: Secondary | ICD-10-CM

## 2017-05-22 LAB — CBC WITH DIFFERENTIAL/PLATELET
BASO%: 0.5 % (ref 0.0–2.0)
BASOS ABS: 0 10*3/uL (ref 0.0–0.1)
EOS%: 1.7 % (ref 0.0–7.0)
Eosinophils Absolute: 0.1 10*3/uL (ref 0.0–0.5)
HEMATOCRIT: 28.9 % — AB (ref 38.4–49.9)
HEMOGLOBIN: 9.3 g/dL — AB (ref 13.0–17.1)
LYMPH#: 0.9 10*3/uL (ref 0.9–3.3)
LYMPH%: 15.3 % (ref 14.0–49.0)
MCH: 29.3 pg (ref 27.2–33.4)
MCHC: 32.2 g/dL (ref 32.0–36.0)
MCV: 91.1 fL (ref 79.3–98.0)
MONO#: 0.7 10*3/uL (ref 0.1–0.9)
MONO%: 11.2 % (ref 0.0–14.0)
NEUT#: 4.3 10*3/uL (ref 1.5–6.5)
NEUT%: 71.3 % (ref 39.0–75.0)
PLATELETS: 135 10*3/uL — AB (ref 140–400)
RBC: 3.17 10*6/uL — ABNORMAL LOW (ref 4.20–5.82)
RDW: 20.1 % — AB (ref 11.0–14.6)
WBC: 6.1 10*3/uL (ref 4.0–10.3)

## 2017-05-22 LAB — COMPREHENSIVE METABOLIC PANEL
ALBUMIN: 3 g/dL — AB (ref 3.5–5.0)
ALK PHOS: 60 U/L (ref 40–150)
ALT: 10 U/L (ref 0–55)
ANION GAP: 9 meq/L (ref 3–11)
AST: 13 U/L (ref 5–34)
BILIRUBIN TOTAL: 0.38 mg/dL (ref 0.20–1.20)
BUN: 17.3 mg/dL (ref 7.0–26.0)
CALCIUM: 9.9 mg/dL (ref 8.4–10.4)
CO2: 25 mEq/L (ref 22–29)
Chloride: 106 mEq/L (ref 98–109)
Creatinine: 1 mg/dL (ref 0.7–1.3)
EGFR: 88 mL/min/{1.73_m2} — AB (ref >90)
GLUCOSE: 136 mg/dL (ref 70–140)
Potassium: 3.6 mEq/L (ref 3.5–5.1)
SODIUM: 141 meq/L (ref 136–145)
TOTAL PROTEIN: 7 g/dL (ref 6.4–8.3)

## 2017-05-29 ENCOUNTER — Ambulatory Visit: Payer: Medicare HMO

## 2017-05-29 ENCOUNTER — Ambulatory Visit: Payer: Medicare HMO | Admitting: Internal Medicine

## 2017-05-29 ENCOUNTER — Other Ambulatory Visit (HOSPITAL_BASED_OUTPATIENT_CLINIC_OR_DEPARTMENT_OTHER): Payer: Medicare HMO

## 2017-05-29 DIAGNOSIS — C3411 Malignant neoplasm of upper lobe, right bronchus or lung: Secondary | ICD-10-CM | POA: Diagnosis not present

## 2017-05-29 DIAGNOSIS — C3491 Malignant neoplasm of unspecified part of right bronchus or lung: Secondary | ICD-10-CM

## 2017-05-29 LAB — COMPREHENSIVE METABOLIC PANEL
ALBUMIN: 2.8 g/dL — AB (ref 3.5–5.0)
ALK PHOS: 64 U/L (ref 40–150)
ALT: 11 U/L (ref 0–55)
ANION GAP: 9 meq/L (ref 3–11)
AST: 20 U/L (ref 5–34)
BILIRUBIN TOTAL: 0.36 mg/dL (ref 0.20–1.20)
BUN: 18.4 mg/dL (ref 7.0–26.0)
CALCIUM: 10 mg/dL (ref 8.4–10.4)
CO2: 28 mEq/L (ref 22–29)
Chloride: 107 mEq/L (ref 98–109)
Creatinine: 1.1 mg/dL (ref 0.7–1.3)
EGFR: 77 mL/min/{1.73_m2} — AB (ref >90)
GLUCOSE: 145 mg/dL — AB (ref 70–140)
Potassium: 3.9 mEq/L (ref 3.5–5.1)
Sodium: 144 mEq/L (ref 136–145)
TOTAL PROTEIN: 7.1 g/dL (ref 6.4–8.3)

## 2017-05-29 LAB — CBC WITH DIFFERENTIAL/PLATELET
BASO%: 0.5 % (ref 0.0–2.0)
BASOS ABS: 0 10*3/uL (ref 0.0–0.1)
EOS ABS: 0.1 10*3/uL (ref 0.0–0.5)
EOS%: 2.2 % (ref 0.0–7.0)
HEMATOCRIT: 28.9 % — AB (ref 38.4–49.9)
HEMOGLOBIN: 8.9 g/dL — AB (ref 13.0–17.1)
LYMPH#: 1.1 10*3/uL (ref 0.9–3.3)
LYMPH%: 19.7 % (ref 14.0–49.0)
MCH: 28.9 pg (ref 27.2–33.4)
MCHC: 30.8 g/dL — ABNORMAL LOW (ref 32.0–36.0)
MCV: 93.8 fL (ref 79.3–98.0)
MONO#: 0.8 10*3/uL (ref 0.1–0.9)
MONO%: 14.3 % — AB (ref 0.0–14.0)
NEUT%: 63.3 % (ref 39.0–75.0)
NEUTROS ABS: 3.7 10*3/uL (ref 1.5–6.5)
PLATELETS: 418 10*3/uL — AB (ref 140–400)
RBC: 3.08 10*6/uL — ABNORMAL LOW (ref 4.20–5.82)
RDW: 17.7 % — ABNORMAL HIGH (ref 11.0–14.6)
WBC: 5.8 10*3/uL (ref 4.0–10.3)

## 2017-06-06 ENCOUNTER — Ambulatory Visit (HOSPITAL_BASED_OUTPATIENT_CLINIC_OR_DEPARTMENT_OTHER): Payer: Medicare HMO | Admitting: Internal Medicine

## 2017-06-06 ENCOUNTER — Telehealth: Payer: Self-pay | Admitting: Internal Medicine

## 2017-06-06 ENCOUNTER — Ambulatory Visit (HOSPITAL_COMMUNITY)
Admission: RE | Admit: 2017-06-06 | Discharge: 2017-06-06 | Disposition: A | Discharge status: Home / Self Care | Payer: Medicare HMO | Source: Ambulatory Visit | Attending: Internal Medicine | Admitting: Internal Medicine

## 2017-06-06 VITALS — BP 141/89 | HR 79 | Resp 19 | Ht 71.0 in | Wt 185.9 lb

## 2017-06-06 DIAGNOSIS — C7951 Secondary malignant neoplasm of bone: Secondary | ICD-10-CM | POA: Diagnosis not present

## 2017-06-06 DIAGNOSIS — E119 Type 2 diabetes mellitus without complications: Secondary | ICD-10-CM

## 2017-06-06 DIAGNOSIS — C3411 Malignant neoplasm of upper lobe, right bronchus or lung: Secondary | ICD-10-CM

## 2017-06-06 DIAGNOSIS — Z5111 Encounter for antineoplastic chemotherapy: Secondary | ICD-10-CM

## 2017-06-06 DIAGNOSIS — R300 Dysuria: Secondary | ICD-10-CM | POA: Diagnosis not present

## 2017-06-06 DIAGNOSIS — C7972 Secondary malignant neoplasm of left adrenal gland: Secondary | ICD-10-CM | POA: Diagnosis not present

## 2017-06-06 DIAGNOSIS — C7800 Secondary malignant neoplasm of unspecified lung: Secondary | ICD-10-CM | POA: Diagnosis not present

## 2017-06-06 DIAGNOSIS — M8448XA Pathological fracture, other site, initial encounter for fracture: Secondary | ICD-10-CM | POA: Diagnosis not present

## 2017-06-06 DIAGNOSIS — C7971 Secondary malignant neoplasm of right adrenal gland: Secondary | ICD-10-CM

## 2017-06-06 DIAGNOSIS — Z7189 Other specified counseling: Secondary | ICD-10-CM

## 2017-06-06 DIAGNOSIS — C3491 Malignant neoplasm of unspecified part of right bronchus or lung: Secondary | ICD-10-CM

## 2017-06-06 DIAGNOSIS — J90 Pleural effusion, not elsewhere classified: Secondary | ICD-10-CM | POA: Insufficient documentation

## 2017-06-06 DIAGNOSIS — I10 Essential (primary) hypertension: Secondary | ICD-10-CM

## 2017-06-06 MED ORDER — IOPAMIDOL (ISOVUE-300) INJECTION 61%
INTRAVENOUS | Status: AC
  Filled 2017-06-06: qty 100

## 2017-06-06 MED ORDER — IOPAMIDOL (ISOVUE-300) INJECTION 61%
100.0000 mL | Freq: Once | INTRAVENOUS | Status: AC | PRN
  Administered 2017-06-06: 100 mL via INTRAVENOUS

## 2017-06-06 NOTE — Progress Notes (Signed)
DISCONTINUE ON PATHWAY REGIMEN - Non-Small Cell Lung     A cycle is every 21 days:     Pemetrexed      Carboplatin   **Always confirm dose/schedule in your pharmacy ordering system**    REASON: Other Reason PRIOR TREATMENT: [id number]: Carboplatin AUC=5 + Pemetrexed 500 mg/m2 q21 Days x 4 Cycles TREATMENT RESPONSE: Partial Response (PR)  START ON PATHWAY REGIMEN - Non-Small Cell Lung     A cycle is every 21 days:     Pemetrexed   **Always confirm dose/schedule in your pharmacy ordering system**    Patient Characteristics: Stage IV Metastatic, Non Squamous, Maintenance - Chemotherapy/Immunotherapy, PS = 0, 1, Initial Pemetrexed + Platinum Agent AJCC T Category: T3 Current Disease Status: Distant Metastases AJCC N Category: N3 AJCC M Category: M1c AJCC 8 Stage Grouping: IVB Histology: Non Squamous Cell ROS1 Rearrangement Status: Quantity Not Sufficient T790M Mutation Status: Not Applicable - EGFR Mutation Negative/Unknown Other Mutations/Biomarkers: No Other Actionable Mutations PD-L1 Expression Status: PD-L1 Negative Chemotherapy/Immunotherapy LOT: Maintenance Chemotherapy/Immunotherapy Molecular Targeted Therapy: Not Appropriate ALK Translocation Status: Negative Would you be surprised if this patient died  in the next year? I would NOT be surprised if this patient died in the next year EGFR Mutation Status: Negative/Wild Type BRAF [id number] Mutation Status: Quantity Not Sufficient Performance Status: PS = 0, 1 Intent of Therapy: Non-Curative / Palliative Intent, Discussed with Patient

## 2017-06-06 NOTE — Telephone Encounter (Signed)
Gave patient calendar and avs

## 2017-06-06 NOTE — Progress Notes (Signed)
[place] [phone]   [phone]  OFFICE PROGRESS NOTE  [name], MD [address] [place] [address]  DIAGNOSIS: Stage IV (T3, N3, M1b) non-small cell lung cancer, adenocarcinoma with negative EGFR, ALK, ROS 1 and BRAF mutations as well as negative PDL 1 expression diagnosed in February 2018 and presented with large right upper lobe lung mass with mediastinal and left cervical lymphadenopathy as well as metastatic disease to the adrenal glands and bones.  PRIOR THERAPY:  Systemic chemotherapy with carboplatin for AUC of 5 and Alimta 500 MG/M2 every 3 weeks. Status post 6 cycles with partial response.  CURRENT THERAPY: Maintenance systemic chemotherapy with single agent Alimta 500 MG/M2 every 3 weeks. First dose 06/19/2017.   INTERVAL HISTORY: Jim [name] 71 y.o. male returns to the clinic today for follow-up visit. The patient is feeling fine today with no specific complaints. The patient denied having any chest pain, shortness of breath, cough or hemoptysis. The patient has no fever or chills. He has no nausea, vomiting, diarrhea or constipation. He denied having any recent weight loss or night sweats. He tolerated the last cycle of his treatment fairly well. He had repeat CT scan of the chest, abdomen and pelvis performed recently and he is here for evaluation and discussion of his scan results and treatment options.  MEDICAL HISTORY: Past Medical History:  Diagnosis Date  . Adenocarcinoma of right lung, stage 4 ([facility]) 01/03/2017  . Alcohol abuse 02/06/2011   with alcohol withdrawal during hospital stay  . Angioedema 02/06/2011   lisinopril  . Cardiomyopathy 02/06/2011   Cardiomyopathy with systolic heart failure, newly diagnosed  . Confusion 02/06/2011   possibly secondary to cerebrovascular accident  versus dementia  . CVA (cerebral infarction) 02/06/2011   Ischemic acute infarct in right anterior cerebral artery  territory  . Diabetes mellitus   . Encounter for antineoplastic chemotherapy 01/03/2017  . Goals of care, counseling/discussion 01/03/2017  . Hypertension    Mild LVH per echo  . Kidney disease, chronic, stage III (GFR 30-59 ml/min)   . Left bundle branch block 02/06/2011  . Marijuana abuse 02/06/2011  . Nonischemic cardiomyopathy (HCC)    EF is 30 to 35%  . Periodontal disease   . Periodontal disease    extensive  . Tobacco abuse     ALLERGIES:  is allergic to metformin and related and lisinopril.  MEDICATIONS:  Current Outpatient Prescriptions  Medication Sig Dispense Refill  . albuterol (PROVENTIL HFA;VENTOLIN HFA) 108 (90 Base) MCG/ACT inhaler Inhale 1 puff into the lungs every 6 (six) hours as needed.    Marland Kitchen aspirin 81 MG tablet Take 81 mg by mouth daily.      Marland Kitchen buPROPion (WELLBUTRIN SR) 100 MG 12 hr tablet Take 100 mg by mouth 2 (two) times daily.    . carvedilol (COREG) 12.5 MG tablet Take 1 tablet by mouth daily.    . ciprofloxacin (CIPRO) 500 MG tablet Take 500 mg by mouth 2 (two) times daily.    . clopidogrel (PLAVIX) 75 MG tablet Take 75 mg by mouth daily with breakfast.    . dexamethasone (DECADRON) 4 MG tablet 4 mg by mouth twice a day the day before, day of and day after the chemotherapy every 3 weeks 40 tablet 1  . diphenhydrAMINE (BENADRYL) 25 MG tablet Take 1 tablet (25 mg total) by mouth every 6 (six) hours. 20 tablet 0  . famotidine (PEPCID) 20 MG tablet  Take 1 tablet (20 mg total) by mouth 2 (two) times daily. 10 tablet 0  . folic acid (FOLVITE) 1 MG tablet take 1 tablet by mouth once daily 30 tablet 4  . hydrALAZINE (APRESOLINE) 25 MG tablet Take 1 tablet (25 mg total) by mouth 2 (two) times daily. 120 tablet 11  . hydrochlorothiazide (HYDRODIURIL) 25 MG tablet Take 25 mg by mouth daily.    . Insulin Glargine (LANTUS) 100 UNIT/ML Solostar Pen Inject 14 Units into the skin 2 (two) times daily. Inject 28 units daily    . isosorbide mononitrate (IMDUR) 30 MG 24 hr tablet  Take 1 tablet (30 mg total) by mouth daily. 30 tablet 11  . isosorbide mononitrate (IMDUR) 30 MG 24 hr tablet TAKE 1 TABLET BY MOUTH DAILY    . multivitamin (THERAGRAN) per tablet Take 1 tablet by mouth daily.      . nitroGLYCERIN (NITROSTAT) 0.4 MG SL tablet Place 0.4 mg under the tongue every 5 (five) minutes as needed for chest pain.     . phenazopyridine (PYRIDIUM) 200 MG tablet Take 1 tablet (200 mg total) by mouth 3 (three) times daily as needed for pain. 30 tablet 0  . prochlorperazine (COMPAZINE) 10 MG tablet Take 1 tablet (10 mg total) by mouth every 6 (six) hours as needed for nausea or vomiting. 30 tablet 0  . rosuvastatin (CRESTOR) 10 MG tablet Take 10 mg by mouth daily.      . tamsulosin (FLOMAX) 0.4 MG CAPS capsule     . traMADol (ULTRAM) 50 MG tablet Take 50 mg by mouth every 8 (eight) hours as needed for moderate pain.    . traZODone (DESYREL) 50 MG tablet Take 50 mg by mouth at bedtime. Take 1/2 to 1 tablet hs     No current facility-administered medications for this visit.    Facility-Administered Medications Ordered in Other Visits  Medication Dose Route Frequency Provider Last Rate Last Dose  . iopamidol (ISOVUE-300) 61 % injection             SURGICAL HISTORY:  Past Surgical History:  Procedure Laterality Date  . US ECHOCARDIOGRAPHY  April 2012   EF 30 to 35 %    REVIEW OF SYSTEMS:  Constitutional: positive for fatigue Eyes: negative Ears, nose, mouth, throat, and face: negative Respiratory: positive for wheezing Cardiovascular: negative Gastrointestinal: negative Genitourinary:negative Integument/breast: negative Hematologic/lymphatic: negative Musculoskeletal:positive for muscle weakness Neurological: negative Behavioral/Psych: negative Endocrine: negative Allergic/Immunologic: negative   PHYSICAL EXAMINATION: General appearance: alert, cooperative, fatigued and no distress Head: Normocephalic, without obvious abnormality, atraumatic Neck: no  adenopathy, no JVD, supple, symmetrical, trachea midline and thyroid not enlarged, symmetric, no tenderness/mass/nodules Lymph nodes: Cervical, supraclavicular, and axillary nodes normal. Resp: clear to auscultation bilaterally Back: symmetric, no curvature. ROM normal. No CVA tenderness. Cardio: regular rate and rhythm, S1, S2 normal, no murmur, click, rub or gallop GI: soft, non-tender; bowel sounds normal; no masses,  no organomegaly Extremities: extremities normal, atraumatic, no cyanosis or edema Neurologic: Alert and oriented X 3, normal strength and tone. Normal symmetric reflexes. Normal coordination and gait  ECOG PERFORMANCE STATUS: 1 - Symptomatic but completely ambulatory  Blood pressure (!) 141/89, pulse 79, resp. rate 19, height 5' 11"  (1.803 m), weight 185 lb 14.4 oz (84.3 kg), SpO2 100 %.  LABORATORY DATA: Lab Results  Component Value Date   WBC 5.8 05/29/2017   HGB 8.9 (L) 05/29/2017   HCT 28.9 (L) 05/29/2017   MCV 93.8 05/29/2017   PLT 418 (H) 05/29/2017  Chemistry      Component Value Date/Time   NA 144 05/29/2017 1138   K 3.9 05/29/2017 1138   CL 99 11/06/2013 1940   CO2 28 05/29/2017 1138   BUN 18.4 05/29/2017 1138   CREATININE 1.1 05/29/2017 1138      Component Value Date/Time   CALCIUM 10.0 05/29/2017 1138   ALKPHOS 64 05/29/2017 1138   AST 20 05/29/2017 1138   ALT 11 05/29/2017 1138   BILITOT 0.36 05/29/2017 1138       RADIOGRAPHIC STUDIES: Ct Chest W Contrast  Result Date: 06/06/2017 CLINICAL DATA:  Restaging right lung cancer. EXAM: CT CHEST, ABDOMEN, AND PELVIS WITH CONTRAST TECHNIQUE: Multidetector CT imaging of the chest, abdomen and pelvis was performed following the standard protocol during bolus administration of intravenous contrast. CONTRAST:  115m ISOVUE-300 IOPAMIDOL (ISOVUE-300) INJECTION 61% COMPARISON:  03/27/2017 FINDINGS: CT CHEST FINDINGS Cardiovascular: The heart is normal in size. No pericardial effusion. Stable tortuosity,  ectasia and scattered calcification of the thoracic aorta. The branch vessels are patent. Stable three-vessel coronary artery calcifications. Mediastinum/Nodes: Small scattered mediastinal lymph nodes are again noted. Small right-sided paraesophageal lymph node on image number 11 measures 5 mm and appears stable. Slightly more inferiorly on image number 14 is 5.5 mm node lymph node which previously measured 9.5 mm. Aorticopulmonary window lymph node on image number 24 measures 11 mm and previously measured 14 mm. No subcarinal adenopathy. Lungs/Pleura: Right hilar mass/ surrounded obstructed lung appears smaller when compared to prior studies. It is very difficult to identify the mass but it measures approximately 2.8 x 2.6 cm and previously measured 4.5 x 3.0 cm. There is a persistent right-sided pleural effusion without definite enhancing pleural nodules. 6 mm irregular pulmonary nodule on image number 54 appears stable. No other definite right-sided pulmonary nodules. Persistent irregular nodular densities in the left lung have decreased in size since the prior study. The larger more lateral lesion on image number 73 measures 8 x 6 mm and previously measured 9 x 7.5 mm. Small irregular nodule in the left lower lobe on image number 61 is stable. 7 mm left lower lobe pulmonary nodule on image number 112 previously measured 11 mm. Left basilar pleural calcifications are again noted. No new pulmonary lesions Musculoskeletal: Diffuse lytic and sclerotic bone disease is again demonstrated with a pathologic fracture of L3 but no compression deformity. No new lesions. CT ABDOMEN PELVIS FINDINGS Hepatobiliary: No findings for hepatic metastatic disease. The gallbladder is normal. No common bile duct dilatation. Pancreas: No mass, inflammation or ductal dilatation. Spleen: Normal size.  No focal lesions. Adrenals/Urinary Tract: Stable bilateral adrenal gland masses consistent with metastatic disease. Both kidneys appear  unremarkable and stable. There are renal calculi and renal cysts. Stomach/Bowel: The stomach, duodenum, small bowel and colon are grossly normal. The terminal ileum and appendix are normal. Vascular/Lymphatic: Stable advanced atherosclerotic calcifications involving the aorta and branch vessels. The major venous structures are patent. Small scattered mesenteric and retroperitoneal lymph nodes but no mass or overt adenopathy. Reproductive: The prostate gland and seminal vesicles are unremarkable. Other: No pelvic mass or adenopathy. No free pelvic fluid collections. No inguinal mass or adenopathy. No abdominal wall hernia or subcutaneous lesions. Musculoskeletal: Stable appearing metastatic lesions involving L3 with a pathologic fracture of the left transverse process. No compression fracture. Diffuse mixed lytic and sclerotic changes in the other lumbar vertebral bodies and sacrum. IMPRESSION: 1. Interval decrease in size of a right lung mass and surrounding drowned/obstructed lung. 2. Stable T smaller  metastatic pulmonary lesions.  No new lesions. 3. Persistent right-sided pleural effusion. 4. Interval decrease in size of mediastinal lymph nodes. 5. Stable bilateral adrenal gland metastasis. No findings for hepatic metastatic disease. 6. Stable osseous metastatic disease. Electronically Signed   By: Marijo Sanes M.D.   On: 06/06/2017 10:56   Ct Abdomen Pelvis W Contrast  Result Date: 06/06/2017 CLINICAL DATA:  Restaging right lung cancer. EXAM: CT CHEST, ABDOMEN, AND PELVIS WITH CONTRAST TECHNIQUE: Multidetector CT imaging of the chest, abdomen and pelvis was performed following the standard protocol during bolus administration of intravenous contrast. CONTRAST:  121m ISOVUE-300 IOPAMIDOL (ISOVUE-300) INJECTION 61% COMPARISON:  03/27/2017 FINDINGS: CT CHEST FINDINGS Cardiovascular: The heart is normal in size. No pericardial effusion. Stable tortuosity, ectasia and scattered calcification of the thoracic  aorta. The branch vessels are patent. Stable three-vessel coronary artery calcifications. Mediastinum/Nodes: Small scattered mediastinal lymph nodes are again noted. Small right-sided paraesophageal lymph node on image number 11 measures 5 mm and appears stable. Slightly more inferiorly on image number 14 is 5.5 mm node lymph node which previously measured 9.5 mm. Aorticopulmonary window lymph node on image number 24 measures 11 mm and previously measured 14 mm. No subcarinal adenopathy. Lungs/Pleura: Right hilar mass/ surrounded obstructed lung appears smaller when compared to prior studies. It is very difficult to identify the mass but it measures approximately 2.8 x 2.6 cm and previously measured 4.5 x 3.0 cm. There is a persistent right-sided pleural effusion without definite enhancing pleural nodules. 6 mm irregular pulmonary nodule on image number 54 appears stable. No other definite right-sided pulmonary nodules. Persistent irregular nodular densities in the left lung have decreased in size since the prior study. The larger more lateral lesion on image number 73 measures 8 x 6 mm and previously measured 9 x 7.5 mm. Small irregular nodule in the left lower lobe on image number 61 is stable. 7 mm left lower lobe pulmonary nodule on image number 112 previously measured 11 mm. Left basilar pleural calcifications are again noted. No new pulmonary lesions Musculoskeletal: Diffuse lytic and sclerotic bone disease is again demonstrated with a pathologic fracture of L3 but no compression deformity. No new lesions. CT ABDOMEN PELVIS FINDINGS Hepatobiliary: No findings for hepatic metastatic disease. The gallbladder is normal. No common bile duct dilatation. Pancreas: No mass, inflammation or ductal dilatation. Spleen: Normal size.  No focal lesions. Adrenals/Urinary Tract: Stable bilateral adrenal gland masses consistent with metastatic disease. Both kidneys appear unremarkable and stable. There are renal calculi and  renal cysts. Stomach/Bowel: The stomach, duodenum, small bowel and colon are grossly normal. The terminal ileum and appendix are normal. Vascular/Lymphatic: Stable advanced atherosclerotic calcifications involving the aorta and branch vessels. The major venous structures are patent. Small scattered mesenteric and retroperitoneal lymph nodes but no mass or overt adenopathy. Reproductive: The prostate gland and seminal vesicles are unremarkable. Other: No pelvic mass or adenopathy. No free pelvic fluid collections. No inguinal mass or adenopathy. No abdominal wall hernia or subcutaneous lesions. Musculoskeletal: Stable appearing metastatic lesions involving L3 with a pathologic fracture of the left transverse process. No compression fracture. Diffuse mixed lytic and sclerotic changes in the other lumbar vertebral bodies and sacrum. IMPRESSION: 1. Interval decrease in size of a right lung mass and surrounding drowned/obstructed lung. 2. Stable T smaller metastatic pulmonary lesions.  No new lesions. 3. Persistent right-sided pleural effusion. 4. Interval decrease in size of mediastinal lymph nodes. 5. Stable bilateral adrenal gland metastasis. No findings for hepatic metastatic disease. 6. Stable osseous  metastatic disease. Electronically Signed   By: Marijo Sanes M.D.   On: 06/06/2017 10:56    ASSESSMENT AND PLAN:  This is a very pleasant 71 years old African-American male with stage IV non-small cell lung cancer, adenocarcinoma with no actionable mutations and currently undergoing systemic chemotherapy was carboplatin and Alimta status post 6 cycles. He tolerated the last cycle of his treatment fairly well. The patient had repeat CT scan of the chest, abdomen and pelvis performed after cycle #6 and it showed interval decrease in the size of the right lung mass and surrounding drowned/obstructed lung. There was no evidence for disease progression. I personally and independently reviewed the scan images and  discuss the results with the patient today. I gave the patient the option of continuation of treatment with maintenance systemic chemotherapy with single agent Alimta versus palliative care and close monitoring. The patient is interested in proceeding with the maintenance therapy. I discussed with him the adverse effect of this treatment including but not limited to mild alopecia, myelosuppression, nausea and vomiting, peripheral neuropathy, liver or renal dysfunction. He is expected to start the first cycle of this treatment on 06/19/2017. I will see him back for follow-up visit in one month's with the start of cycle #2. He was advised to call immediately if he has any concerning symptoms in the interval. For hypertension, the patient will continue his current treatment with Coreg and hydrochlorothiazide. For pain management, he will continue on tramadol. The patient voices understanding of current disease status and treatment options and is in agreement with the current care plan. All questions were answered. The patient knows to call the clinic with any problems, questions or concerns. We can certainly see the patient much sooner if necessary.  Disclaimer: This note was dictated with voice recognition software. Similar sounding words can inadvertently be transcribed and may not be corrected upon review.

## 2017-06-08 ENCOUNTER — Encounter: Payer: Self-pay | Admitting: Internal Medicine

## 2017-06-19 ENCOUNTER — Ambulatory Visit (HOSPITAL_BASED_OUTPATIENT_CLINIC_OR_DEPARTMENT_OTHER): Payer: Medicare HMO

## 2017-06-19 ENCOUNTER — Other Ambulatory Visit (HOSPITAL_BASED_OUTPATIENT_CLINIC_OR_DEPARTMENT_OTHER): Payer: Medicare HMO

## 2017-06-19 VITALS — BP 113/85 | HR 89 | Temp 98.9°F | Resp 20

## 2017-06-19 DIAGNOSIS — C3411 Malignant neoplasm of upper lobe, right bronchus or lung: Secondary | ICD-10-CM | POA: Diagnosis not present

## 2017-06-19 DIAGNOSIS — C7971 Secondary malignant neoplasm of right adrenal gland: Secondary | ICD-10-CM | POA: Diagnosis not present

## 2017-06-19 DIAGNOSIS — C7951 Secondary malignant neoplasm of bone: Secondary | ICD-10-CM

## 2017-06-19 DIAGNOSIS — Z5111 Encounter for antineoplastic chemotherapy: Secondary | ICD-10-CM

## 2017-06-19 DIAGNOSIS — C3491 Malignant neoplasm of unspecified part of right bronchus or lung: Secondary | ICD-10-CM

## 2017-06-19 DIAGNOSIS — C7972 Secondary malignant neoplasm of left adrenal gland: Secondary | ICD-10-CM

## 2017-06-19 LAB — COMPREHENSIVE METABOLIC PANEL
ALK PHOS: 63 U/L (ref 40–150)
ALT: 9 U/L (ref 0–55)
ANION GAP: 9 meq/L (ref 3–11)
AST: 19 U/L (ref 5–34)
Albumin: 2.4 g/dL — ABNORMAL LOW (ref 3.5–5.0)
BUN: 13.1 mg/dL (ref 7.0–26.0)
CALCIUM: 10.1 mg/dL (ref 8.4–10.4)
CO2: 25 mEq/L (ref 22–29)
CREATININE: 1.1 mg/dL (ref 0.7–1.3)
Chloride: 105 mEq/L (ref 98–109)
EGFR: 75 mL/min/{1.73_m2} — ABNORMAL LOW (ref >90)
Glucose: 162 mg/dl — ABNORMAL HIGH (ref 70–140)
POTASSIUM: 3.9 meq/L (ref 3.5–5.1)
SODIUM: 139 meq/L (ref 136–145)
Total Bilirubin: 0.36 mg/dL (ref 0.20–1.20)
Total Protein: 7.2 g/dL (ref 6.4–8.3)

## 2017-06-19 LAB — CBC WITH DIFFERENTIAL/PLATELET
BASO%: 0.8 % (ref 0.0–2.0)
BASOS ABS: 0.1 10*3/uL (ref 0.0–0.1)
EOS%: 1.2 % (ref 0.0–7.0)
Eosinophils Absolute: 0.1 10*3/uL (ref 0.0–0.5)
HEMATOCRIT: 29 % — AB (ref 38.4–49.9)
HGB: 9.4 g/dL — ABNORMAL LOW (ref 13.0–17.1)
LYMPH#: 1 10*3/uL (ref 0.9–3.3)
LYMPH%: 10.5 % — ABNORMAL LOW (ref 14.0–49.0)
MCH: 28.8 pg (ref 27.2–33.4)
MCHC: 32.3 g/dL (ref 32.0–36.0)
MCV: 89.2 fL (ref 79.3–98.0)
MONO#: 1 10*3/uL — ABNORMAL HIGH (ref 0.1–0.9)
MONO%: 10.2 % (ref 0.0–14.0)
NEUT#: 7.5 10*3/uL — ABNORMAL HIGH (ref 1.5–6.5)
NEUT%: 77.3 % — AB (ref 39.0–75.0)
Platelets: 388 10*3/uL (ref 140–400)
RBC: 3.25 10*6/uL — AB (ref 4.20–5.82)
RDW: 17.4 % — ABNORMAL HIGH (ref 11.0–14.6)
WBC: 9.7 10*3/uL (ref 4.0–10.3)

## 2017-06-19 MED ORDER — SODIUM CHLORIDE 0.9 % IV SOLN
Freq: Once | INTRAVENOUS | Status: AC
  Administered 2017-06-19: 13:00:00 via INTRAVENOUS

## 2017-06-19 MED ORDER — PROCHLORPERAZINE MALEATE 10 MG PO TABS
ORAL_TABLET | ORAL | Status: AC
  Filled 2017-06-19: qty 1

## 2017-06-19 MED ORDER — PROCHLORPERAZINE MALEATE 10 MG PO TABS
10.0000 mg | ORAL_TABLET | Freq: Once | ORAL | Status: AC
  Administered 2017-06-19: 10 mg via ORAL

## 2017-06-19 MED ORDER — SODIUM CHLORIDE 0.9 % IV SOLN
490.0000 mg/m2 | Freq: Once | INTRAVENOUS | Status: AC
  Administered 2017-06-19: 1000 mg via INTRAVENOUS
  Filled 2017-06-19: qty 40

## 2017-06-19 NOTE — Patient Instructions (Signed)
[facility] Discharge Instructions for Patients Receiving Chemotherapy  Today you received the following chemotherapy agents Alimta.  To help prevent nausea and vomiting after your treatment, we encourage you to take your nausea medication as prescribed.   If you develop nausea and vomiting that is not controlled by your nausea medication, call the clinic.   BELOW ARE SYMPTOMS THAT SHOULD BE REPORTED IMMEDIATELY:  *FEVER GREATER THAN 100.5 F  *CHILLS WITH OR WITHOUT FEVER  NAUSEA AND VOMITING THAT IS NOT CONTROLLED WITH YOUR NAUSEA MEDICATION  *UNUSUAL SHORTNESS OF BREATH  *UNUSUAL BRUISING OR BLEEDING  TENDERNESS IN MOUTH AND THROAT WITH OR WITHOUT PRESENCE OF ULCERS  *URINARY PROBLEMS  *BOWEL PROBLEMS  UNUSUAL RASH Items with * indicate a potential emergency and should be followed up as soon as possible.  Feel free to call the clinic you have any questions or concerns. The clinic phone number is [phone].  Please show the CHEMO ALERT CARD at check-in to the Emergency Department and triage nurse.   

## 2017-07-04 ENCOUNTER — Encounter: Payer: Medicare HMO | Admitting: Podiatry

## 2017-07-05 ENCOUNTER — Telehealth: Payer: Self-pay

## 2017-07-05 NOTE — Telephone Encounter (Signed)
Pt had alimta on 8/22. He feels it affected him differently this time. His feet ankles and knees are swollen. His feet and ankles are round and sore. His knees will swell with walking around and will go down with elevation.   He saw Dr Rachell Cipro vascular on 9/5. He had ABIS with TPS testing done at that time   He saw Dr Laurance Flatten orthopedic on 8/31 and he had fluid aspiration from left knee and steroid injection at that time.   He is letting Dr Julien Nordmann know of his condition. Next appt 9/12 lab, Erasmo Downer, infusion

## 2017-07-10 ENCOUNTER — Ambulatory Visit: Payer: Medicare HMO

## 2017-07-10 ENCOUNTER — Ambulatory Visit (HOSPITAL_COMMUNITY)
Admission: RE | Admit: 2017-07-10 | Discharge: 2017-07-10 | Disposition: A | Discharge status: Home / Self Care | Payer: Medicare HMO | Source: Ambulatory Visit | Attending: Internal Medicine | Admitting: Internal Medicine

## 2017-07-10 ENCOUNTER — Encounter: Payer: Self-pay | Admitting: Oncology

## 2017-07-10 ENCOUNTER — Ambulatory Visit (HOSPITAL_BASED_OUTPATIENT_CLINIC_OR_DEPARTMENT_OTHER): Payer: Medicare HMO | Admitting: Oncology

## 2017-07-10 ENCOUNTER — Other Ambulatory Visit (HOSPITAL_BASED_OUTPATIENT_CLINIC_OR_DEPARTMENT_OTHER): Payer: Medicare HMO

## 2017-07-10 ENCOUNTER — Ambulatory Visit (HOSPITAL_BASED_OUTPATIENT_CLINIC_OR_DEPARTMENT_OTHER): Payer: Medicare HMO

## 2017-07-10 VITALS — BP 100/67 | HR 98 | Temp 98.8°F | Resp 18 | Ht 71.0 in | Wt 181.2 lb

## 2017-07-10 DIAGNOSIS — C7971 Secondary malignant neoplasm of right adrenal gland: Secondary | ICD-10-CM

## 2017-07-10 DIAGNOSIS — R600 Localized edema: Secondary | ICD-10-CM

## 2017-07-10 DIAGNOSIS — R5383 Other fatigue: Secondary | ICD-10-CM

## 2017-07-10 DIAGNOSIS — C7972 Secondary malignant neoplasm of left adrenal gland: Secondary | ICD-10-CM | POA: Diagnosis not present

## 2017-07-10 DIAGNOSIS — C3491 Malignant neoplasm of unspecified part of right bronchus or lung: Secondary | ICD-10-CM

## 2017-07-10 DIAGNOSIS — D649 Anemia, unspecified: Secondary | ICD-10-CM

## 2017-07-10 DIAGNOSIS — C7951 Secondary malignant neoplasm of bone: Secondary | ICD-10-CM

## 2017-07-10 DIAGNOSIS — R300 Dysuria: Secondary | ICD-10-CM

## 2017-07-10 DIAGNOSIS — C3411 Malignant neoplasm of upper lobe, right bronchus or lung: Secondary | ICD-10-CM

## 2017-07-10 DIAGNOSIS — Z5111 Encounter for antineoplastic chemotherapy: Secondary | ICD-10-CM

## 2017-07-10 LAB — URINALYSIS, MICROSCOPIC - CHCC
BILIRUBIN (URINE): NEGATIVE
Glucose: NEGATIVE mg/dL
Ketones: NEGATIVE mg/dL
LEUKOCYTE ESTERASE: NEGATIVE
NITRITE: NEGATIVE
PH: 6.5 (ref 4.6–8.0)
Specific Gravity, Urine: 1.015 (ref 1.003–1.035)
Urobilinogen, UR: 0.2 mg/dL (ref 0.2–1)

## 2017-07-10 LAB — CBC WITH DIFFERENTIAL/PLATELET
BASO%: 0.3 % (ref 0.0–2.0)
BASOS ABS: 0 10*3/uL (ref 0.0–0.1)
EOS ABS: 0.1 10*3/uL (ref 0.0–0.5)
EOS%: 0.6 % (ref 0.0–7.0)
HCT: 26.3 % — ABNORMAL LOW (ref 38.4–49.9)
HGB: 8 g/dL — ABNORMAL LOW (ref 13.0–17.1)
LYMPH%: 14.9 % (ref 14.0–49.0)
MCH: 27.4 pg (ref 27.2–33.4)
MCHC: 30.4 g/dL — AB (ref 32.0–36.0)
MCV: 90.1 fL (ref 79.3–98.0)
MONO#: 1.3 10*3/uL — AB (ref 0.1–0.9)
MONO%: 14 % (ref 0.0–14.0)
NEUT#: 6.3 10*3/uL (ref 1.5–6.5)
NEUT%: 70.2 % (ref 39.0–75.0)
PLATELETS: 494 10*3/uL — AB (ref 140–400)
RBC: 2.92 10*6/uL — AB (ref 4.20–5.82)
RDW: 16.9 % — ABNORMAL HIGH (ref 11.0–14.6)
WBC: 9 10*3/uL (ref 4.0–10.3)
lymph#: 1.3 10*3/uL (ref 0.9–3.3)

## 2017-07-10 LAB — COMPREHENSIVE METABOLIC PANEL
ALT: 10 U/L (ref 0–55)
ANION GAP: 11 meq/L (ref 3–11)
AST: 18 U/L (ref 5–34)
Albumin: 2.2 g/dL — ABNORMAL LOW (ref 3.5–5.0)
Alkaline Phosphatase: 69 U/L (ref 40–150)
BILIRUBIN TOTAL: 0.41 mg/dL (ref 0.20–1.20)
BUN: 13 mg/dL (ref 7.0–26.0)
CALCIUM: 9.9 mg/dL (ref 8.4–10.4)
CHLORIDE: 102 meq/L (ref 98–109)
CO2: 24 meq/L (ref 22–29)
Creatinine: 1.2 mg/dL (ref 0.7–1.3)
EGFR: 71 mL/min/{1.73_m2} — AB (ref >90)
Glucose: 150 mg/dl — ABNORMAL HIGH (ref 70–140)
Potassium: 3.7 mEq/L (ref 3.5–5.1)
Sodium: 137 mEq/L (ref 136–145)
Total Protein: 7 g/dL (ref 6.4–8.3)

## 2017-07-10 LAB — ABO/RH: ABO/RH(D): O POS

## 2017-07-10 LAB — PREPARE RBC (CROSSMATCH)

## 2017-07-10 MED ORDER — DIPHENHYDRAMINE HCL 25 MG PO CAPS
25.0000 mg | ORAL_CAPSULE | Freq: Once | ORAL | Status: AC
  Administered 2017-07-10: 25 mg via ORAL

## 2017-07-10 MED ORDER — ACETAMINOPHEN 325 MG PO TABS
650.0000 mg | ORAL_TABLET | Freq: Once | ORAL | Status: AC
  Administered 2017-07-10: 650 mg via ORAL

## 2017-07-10 MED ORDER — SODIUM CHLORIDE 0.9% FLUSH
3.0000 mL | INTRAVENOUS | Status: DC | PRN
  Filled 2017-07-10: qty 10

## 2017-07-10 MED ORDER — HEPARIN SOD (PORK) LOCK FLUSH 100 UNIT/ML IV SOLN
500.0000 [IU] | Freq: Every day | INTRAVENOUS | Status: DC | PRN
  Filled 2017-07-10: qty 5

## 2017-07-10 MED ORDER — SODIUM CHLORIDE 0.9% FLUSH
10.0000 mL | INTRAVENOUS | Status: DC | PRN
  Filled 2017-07-10: qty 10

## 2017-07-10 MED ORDER — HEPARIN SOD (PORK) LOCK FLUSH 100 UNIT/ML IV SOLN
250.0000 [IU] | INTRAVENOUS | Status: DC | PRN
  Filled 2017-07-10: qty 5

## 2017-07-10 MED ORDER — DIPHENHYDRAMINE HCL 25 MG PO CAPS
ORAL_CAPSULE | ORAL | Status: AC
  Filled 2017-07-10: qty 1

## 2017-07-10 MED ORDER — ACETAMINOPHEN 325 MG PO TABS
ORAL_TABLET | ORAL | Status: AC
  Filled 2017-07-10: qty 2

## 2017-07-10 MED ORDER — SODIUM CHLORIDE 0.9 % IV SOLN
250.0000 mL | Freq: Once | INTRAVENOUS | Status: AC
  Administered 2017-07-10: 15:00:00 via INTRAVENOUS

## 2017-07-10 NOTE — Assessment & Plan Note (Signed)
The patient's hemoglobin is 8.0 today. He has increased fatigue and weakness. The patient will receive 2 units of packed red blood cells this week. Risks and benefits of a blood transfusion have been discussed with the patient and he is agreement to proceeding.

## 2017-07-10 NOTE — Progress Notes (Signed)
Urine culture obtained for u/a and culture.  Pt to be transfused 2 units PRBC's..1 today and 1 tomorrow.. Also pt to have doppler study of left lower extremity tomorrow morning @ 9 am, then receive 2nd unit blood after that.  Pt aware. Given written instructions.  Pt transported to infusion area after type and cross to get 1st unit of blood.

## 2017-07-10 NOTE — Patient Instructions (Signed)

## 2017-07-10 NOTE — Progress Notes (Signed)
[name]:    Jim Mires, MD [address] [place] [address]   DIAGNOSIS: Stage IV (T3, N3, M1b) non-small cell lung cancer, adenocarcinoma with negative EGFR, ALK, ROS 1 and BRAF mutations as well as negative PDL 1 expression diagnosed in February 2018 and presented with large right upper lobe lung mass with mediastinal and left cervical lymphadenopathy as well as metastatic disease to the adrenal glands and bones.  SUMMARY OF ONCOLOGIC HISTORY:  No history exists.   PRIOR THERAPY:  Systemic chemotherapy with carboplatin for AUC of 5 and Alimta 500 MG/M2 every 3 weeks. Status post 6 cycles with partial response.  CURRENT THERAPY: Maintenance systemic chemotherapy with single agent Alimta 500 MG/M2 every 3 weeks. First dose 06/19/2017. Status post 1 cycle.  INTERVAL HISTORY: Jim [name] 71 y.o. male returns for routine visit by himself. The patient received his first cycle of maintenance Alimta on August 22. He reports that he felt worse than he did with the carboplatin and Alimta. He has been much weaker. He has fallen and injured his knees. He was seen by orthopedics who performed a joint aspiration as well as an injection of corticosteroids and to his bilateral knees. Knees are still painful and he takes tramadol as needed. He also reports that he has been voiding more frequently and has some burning with urination. He denies fevers and chills. Denies chest pain, shortness breath at rest, hemoptysis. He reports mild dyspnea on exertion as well as a nonproductive cough. Denies nausea, vomiting, constipation, diarrhea. He reports that his appetite is good, but he has lost more weight. He also reports swelling and pain to his left lower extremity. He denies any bleeding. The patient is here for evaluation prior to cycle #2 of his maintenance Alimta.   Patient Active Problem List   Diagnosis Date Noted  . Anemia 07/10/2017  .  Lower extremity edema 07/10/2017  . Dysuria 01/23/2017  . Adenocarcinoma of right lung, stage 4 ([place]) 01/03/2017  . Goals of care, counseling/discussion 01/03/2017  . Encounter for antineoplastic chemotherapy 01/03/2017  . Multiple substance abuse 03/09/2011  . Nonischemic cardiomyopathy ([place])   . Kidney disease, chronic, stage III (GFR 30-59 ml/min)   . Hypertension   . CVA (cerebral infarction) 02/06/2011    is allergic to lisinopril and metformin and related.  MEDICAL HISTORY: Past Medical History:  Diagnosis Date  . Adenocarcinoma of right lung, stage 4 ([place]) 01/03/2017  . Alcohol abuse 02/06/2011   with alcohol withdrawal during hospital stay  . Angioedema 02/06/2011   lisinopril  . Cardiomyopathy 02/06/2011   Cardiomyopathy with systolic heart failure, newly diagnosed  . Confusion 02/06/2011   possibly secondary to cerebrovascular accident  versus dementia  . CVA (cerebral infarction) 02/06/2011   Ischemic acute infarct in right anterior cerebral artery territory  . Diabetes mellitus   . Encounter for antineoplastic chemotherapy 01/03/2017  . Goals of care, counseling/discussion 01/03/2017  . Hypertension    Mild LVH per echo  . Kidney disease, chronic, stage III (GFR 30-59 ml/min)   . Left bundle branch block 02/06/2011  . Marijuana abuse 02/06/2011  . Nonischemic cardiomyopathy ([facility])    EF is 30 to 35%  . Periodontal disease   . Periodontal disease    extensive  . Tobacco abuse     SURGICAL HISTORY: Past Surgical History:  Procedure Laterality Date  . US ECHOCARDIOGRAPHY  April 2012   EF 30 to 35 %  SOCIAL HISTORY: Social History   Social History  . Marital status: Single    Spouse name: N/A  . Number of children: N/A  . Years of education: N/A   Occupational History  . Not on file.   Social History Main Topics  . Smoking status: Former Smoker    Packs/day: 0.50    Types: Cigarettes    Quit date: 01/28/2011  . Smokeless tobacco: Never Used  . Alcohol  use Yes     Comment: Moderate  . Drug use: No  . Sexual activity: Not on file   Other Topics Concern  . Not on file   Social History Narrative  . No narrative on file    FAMILY HISTORY: Family History  Problem Relation Age of Onset  . Cancer Mother   . Cancer Father   . Coronary artery disease Other        Grandparents, unspecified    Review of Systems  Constitutional: Positive for fatigue and unexpected weight change. Negative for appetite change, chills and fever.  HENT:  Negative.   Eyes: Negative.   Respiratory: Positive for cough. Negative for hemoptysis, shortness of breath and wheezing.   Cardiovascular: Positive for leg swelling. Negative for chest pain and palpitations.  Gastrointestinal: Negative.   Genitourinary: Positive for dysuria and frequency. Negative for hematuria.   Musculoskeletal:       Bilateral knee pain.  Skin: Negative.   Neurological: Negative.   Hematological: Negative.   Psychiatric/Behavioral: Negative.       PHYSICAL EXAMINATION  ECOG PERFORMANCE STATUS: 2 - Symptomatic, <50% confined to bed  Vitals:   07/10/17 1327  BP: 100/67  Pulse: 98  Resp: 18  Temp: 98.8 F (37.1 C)  SpO2: 100%    Physical Exam  Constitutional: He is oriented to person, place, and time and well-developed, well-nourished, and in no distress. No distress.  HENT:  Head: Normocephalic.  Mouth/Throat: Oropharynx is clear and moist. No oropharyngeal exudate.  Eyes: Conjunctivae are normal. No scleral icterus.  Neck: Normal range of motion. Neck supple.  Cardiovascular: Normal rate, regular rhythm, normal heart sounds and intact distal pulses.   Pulmonary/Chest: Effort normal and breath sounds normal. No respiratory distress. He has no wheezes. He has no rales.  Abdominal: Soft. Bowel sounds are normal. He exhibits no distension and no mass. There is no tenderness.  Musculoskeletal: Normal range of motion. He exhibits edema.  Left lower extremity 2+ edema.  Right lower extremity with trace edema.  Lymphadenopathy:    He has no cervical adenopathy.  Neurological: He is alert and oriented to person, place, and time. He exhibits normal muscle tone.  Skin: Skin is warm and dry. No rash noted. He is not diaphoretic. No erythema. No pallor.  Psychiatric: Mood, memory, affect and judgment normal.  Vitals reviewed.   LABORATORY DATA:  CBC    Component Value Date/Time   WBC 9.0 07/10/2017 1258   WBC 8.7 11/06/2013 1940   RBC 2.92 (L) 07/10/2017 1258   RBC 4.60 11/06/2013 1940   HGB 8.0 (L) 07/10/2017 1258   HCT 26.3 (L) 07/10/2017 1258   PLT 494 (H) 07/10/2017 1258   MCV 90.1 07/10/2017 1258   MCH 27.4 07/10/2017 1258   MCH 30.4 11/06/2013 1940   MCHC 30.4 (L) 07/10/2017 1258   MCHC 33.8 11/06/2013 1940   RDW 16.9 (H) 07/10/2017 1258   LYMPHSABS 1.3 07/10/2017 1258   MONOABS 1.3 (H) 07/10/2017 1258   EOSABS 0.1 07/10/2017 1258   BASOSABS  0.0 07/10/2017 1258    CMP     Component Value Date/Time   NA 137 07/10/2017 1258   K 3.7 07/10/2017 1258   CL 99 11/06/2013 1940   CO2 24 07/10/2017 1258   GLUCOSE 150 (H) 07/10/2017 1258   BUN 13.0 07/10/2017 1258   CREATININE 1.2 07/10/2017 1258   CALCIUM 9.9 07/10/2017 1258   PROT 7.0 07/10/2017 1258   ALBUMIN 2.2 (L) 07/10/2017 1258   AST 18 07/10/2017 1258   ALT 10 07/10/2017 1258   ALKPHOS 69 07/10/2017 1258   BILITOT 0.41 07/10/2017 1258   GFRNONAA 75 (L) 11/06/2013 1940   GFRAA 87 (L) 11/06/2013 1940    RADIOGRAPHIC STUDIES:  No results found.   ASSESSMENT and THERAPY PLAN:   Adenocarcinoma of right lung, stage 4 ([place]) This is a very pleasant 89 year old African-American male with stage IV non-small cell lung cancer, adenocarcinoma with no actionable mutations and currently undergoing systemic chemotherapy was carboplatin and Alimta status post 6 cycles. He tolerated the last cycle of his treatment fairly well. The patient had repeat CT scan of the chest, abdomen and  pelvis performed after cycle #6 and it showed interval decrease in the size of the right lung mass and surrounding drowned/obstructed lung. There was no evidence for disease progression.  The patient is now receiving maintenance therapy with single agent Alimta. He reported increased fatigue and weakness following his first cycle. He has also developed anemia, left lower extremity swelling, and urinary frequency and dysuria.  Patient was seen with Dr. Julien Nordmann. We will delay his chemotherapy by one week.  The patient will be reevaluated next week prior to cycle 2 of his maintenance Alimta.  For pain management, he will continue on tramadol.  The patient voices understanding of current disease status and treatment options and is in agreement with the current care plan.  Anemia The patient's hemoglobin is 8.0 today. He has increased fatigue and weakness. The patient will receive 2 units of packed red blood cells this week. Risks and benefits of a blood transfusion have been discussed with the patient and he is agreement to proceeding.  Dysuria The patient is having increased urinary frequency and dysuria. We will check a urinalysis today. A urine culture has also been ordered. Will treat if needed pending these results.  Lower extremity edema The patient has left lower extremity edema and pain. Stat Doppler ultrasound has been ordered to evaluate for DVT.   Orders Placed This Encounter  Procedures  . Urine Culture    Standing Status:   Future    Number of Occurrences:   1    Standing Expiration Date:   07/10/2018  . Urinalysis, Microscopic - [facility]    Standing Status:   Future    Number of Occurrences:   1    Standing Expiration Date:   07/10/2018  . CBC with Differential/Platelet    Standing Status:   Future    Standing Expiration Date:   07/10/2018  . Comprehensive metabolic panel    Standing Status:   Future    Standing Expiration Date:   07/10/2018  . Practitioner attestation of  consent    I, the ordering practitioner, attest that I have discussed with the patient the benefits, risks, side effects, alternatives, likelihood of achieving goals and potential problems during recovery for the procedure listed.    Standing Status:   Future    Standing Expiration Date:   07/10/2018    Order Specific Question:  Procedure    Answer:   Blood Product(s)  . Complete patient signature process for consent form    Standing Status:   Future    Standing Expiration Date:   07/10/2018  . Care order/instruction    Transfuse Parameters    Standing Status:   Future    Standing Expiration Date:   07/10/2018  . Hold Tube, Blood Bank    Standing Status:   Future    Standing Expiration Date:   07/10/2018  . Type and screen    Standing Status:   Future    Number of Occurrences:   1    Standing Expiration Date:   07/10/2018    All questions were answered. The patient knows to call the clinic with any problems, questions or concerns. We can certainly see the patient much sooner if necessary.  Mikey Bussing, NP 07/10/2017   ADDENDUM: Hematology/Oncology Attending: I had a face to face encounter with the patient today. I recommended his care plan. This is a very pleasant 71 years old African-American male with metastatic non-small cell lung cancer, adenocarcinoma status post induction systemic chemotherapy with carboplatin, and Alimta for 6 cycles with partial response. The patient was started on maintenance treatment with single agent Alimta status post 1 cycle. He was here today for evaluation before starting cycle #2 but he was complaining of increasing fatigue and weakness as well as the swelling of the left lower extremity. His hemoglobin and hematocrit were low today. We will arrange for the patient to receive 2 units of PRBCs transfusion this week. I will delay the start of cycle #2 x 1 week. For the swelling of the lower extremities, we will arrange for the patient to have a blood of  the lower extremity to rule out deep venous thrombosis. We will see the patient back for follow-up visit in one week for reevaluation before starting the next cycle of his treatment. He was advised to call immediately if he has any concerning symptoms in the interval.  Disclaimer: This note was dictated with voice recognition software. Similar sounding words can inadvertently be transcribed and may be missed upon review. Eilleen Kempf, MD 07/10/17

## 2017-07-10 NOTE — Assessment & Plan Note (Signed)
This is a very pleasant 71 year old African-American male with stage IV non-small cell lung cancer, adenocarcinoma with no actionable mutations and currently undergoing systemic chemotherapy was carboplatin and Alimta status post 6 cycles. He tolerated the last cycle of his treatment fairly well. The patient had repeat CT scan of the chest, abdomen and pelvis performed after cycle #6 and it showed interval decrease in the size of the right lung mass and surrounding drowned/obstructed lung. There was no evidence for disease progression.  The patient is now receiving maintenance therapy with single agent Alimta. He reported increased fatigue and weakness following his first cycle. He has also developed anemia, left lower extremity swelling, and urinary frequency and dysuria.  Patient was seen with Dr. Julien Nordmann. We will delay his chemotherapy by one week.  The patient will be reevaluated next week prior to cycle 2 of his maintenance Alimta.  For pain management, he will continue on tramadol.  The patient voices understanding of current disease status and treatment options and is in agreement with the current care plan.

## 2017-07-10 NOTE — Patient Instructions (Signed)
We will delay your chemotherapy by one week. You will receive 1 unit of blood today.  You will have an ultrasound of your left lower leg tomorrow morning at [facility]. Please arrive at 8:45 AM and go to admitting to register.   After you have had your ultrasound, come to the [facility] and register. We will then bring you back in give you an additional unit of blood at our office tomorrow on September 13.

## 2017-07-10 NOTE — Assessment & Plan Note (Signed)
The patient is having increased urinary frequency and dysuria. We will check a urinalysis today. A urine culture has also been ordered. Will treat if needed pending these results.

## 2017-07-10 NOTE — Assessment & Plan Note (Signed)
The patient has left lower extremity edema and pain. Stat Doppler ultrasound has been ordered to evaluate for DVT.

## 2017-07-11 ENCOUNTER — Telehealth: Payer: Self-pay | Admitting: Oncology

## 2017-07-11 ENCOUNTER — Ambulatory Visit (HOSPITAL_COMMUNITY)
Admission: RE | Admit: 2017-07-11 | Discharge: 2017-07-11 | Disposition: A | Discharge status: Home / Self Care | Payer: Medicare HMO | Source: Ambulatory Visit | Attending: Oncology | Admitting: Oncology

## 2017-07-11 ENCOUNTER — Ambulatory Visit (HOSPITAL_BASED_OUTPATIENT_CLINIC_OR_DEPARTMENT_OTHER): Payer: Medicare HMO

## 2017-07-11 VITALS — BP 100/68 | HR 88 | Temp 98.6°F | Resp 18

## 2017-07-11 DIAGNOSIS — R600 Localized edema: Secondary | ICD-10-CM | POA: Diagnosis present

## 2017-07-11 DIAGNOSIS — D649 Anemia, unspecified: Secondary | ICD-10-CM | POA: Diagnosis not present

## 2017-07-11 DIAGNOSIS — C3491 Malignant neoplasm of unspecified part of right bronchus or lung: Secondary | ICD-10-CM

## 2017-07-11 LAB — URINE CULTURE

## 2017-07-11 MED ORDER — ACETAMINOPHEN 325 MG PO TABS
ORAL_TABLET | ORAL | Status: AC
  Filled 2017-07-11: qty 2

## 2017-07-11 MED ORDER — SODIUM CHLORIDE 0.9% FLUSH
3.0000 mL | INTRAVENOUS | Status: DC | PRN
Start: 2017-07-11 — End: 2017-07-11
  Filled 2017-07-11: qty 10

## 2017-07-11 MED ORDER — SODIUM CHLORIDE 0.9 % IV SOLN
250.0000 mL | Freq: Once | INTRAVENOUS | Status: AC
  Administered 2017-07-11: 250 mL via INTRAVENOUS

## 2017-07-11 MED ORDER — ACETAMINOPHEN 325 MG PO TABS
650.0000 mg | ORAL_TABLET | Freq: Once | ORAL | Status: AC
  Administered 2017-07-11: 650 mg via ORAL

## 2017-07-11 NOTE — Progress Notes (Signed)
*  PRELIMINARY RESULTS* Vascular Ultrasound Left lower extremity venous duplex has been completed.  Preliminary findings: No evidence of DVT or baker's cyst.  Called results to [facility].   Landry Mellow, RDMS, RVT  07/11/2017, 9:17 AM

## 2017-07-11 NOTE — Patient Instructions (Signed)

## 2017-07-11 NOTE — Telephone Encounter (Signed)
Scheduled appt per 9/12 los - gave patient schedule in the treatment area.

## 2017-07-12 LAB — BPAM RBC
BLOOD PRODUCT EXPIRATION DATE: 201810132359
Blood Product Expiration Date: 201810132359
ISSUE DATE / TIME: 201809131111
ISSUE DATE / TIME: 201809121636
UNIT TYPE AND RH: 5100
Unit Type and Rh: 5100

## 2017-07-12 LAB — TYPE AND SCREEN
ABO/RH(D): O POS
Antibody Screen: NEGATIVE
Unit division: 0
Unit division: 0

## 2017-07-18 ENCOUNTER — Ambulatory Visit (HOSPITAL_BASED_OUTPATIENT_CLINIC_OR_DEPARTMENT_OTHER): Payer: Medicare HMO

## 2017-07-18 ENCOUNTER — Ambulatory Visit (HOSPITAL_BASED_OUTPATIENT_CLINIC_OR_DEPARTMENT_OTHER): Payer: Medicare HMO | Admitting: Oncology

## 2017-07-18 ENCOUNTER — Telehealth: Payer: Self-pay | Admitting: Medical Oncology

## 2017-07-18 ENCOUNTER — Other Ambulatory Visit (HOSPITAL_BASED_OUTPATIENT_CLINIC_OR_DEPARTMENT_OTHER): Payer: Medicare HMO

## 2017-07-18 ENCOUNTER — Encounter: Payer: Self-pay | Admitting: Oncology

## 2017-07-18 VITALS — BP 105/64 | HR 88 | Temp 98.2°F | Resp 18 | Ht 71.0 in | Wt 181.0 lb

## 2017-07-18 DIAGNOSIS — D649 Anemia, unspecified: Secondary | ICD-10-CM

## 2017-07-18 DIAGNOSIS — Z5111 Encounter for antineoplastic chemotherapy: Secondary | ICD-10-CM

## 2017-07-18 DIAGNOSIS — C7951 Secondary malignant neoplasm of bone: Secondary | ICD-10-CM

## 2017-07-18 DIAGNOSIS — R600 Localized edema: Secondary | ICD-10-CM | POA: Diagnosis not present

## 2017-07-18 DIAGNOSIS — C3411 Malignant neoplasm of upper lobe, right bronchus or lung: Secondary | ICD-10-CM

## 2017-07-18 DIAGNOSIS — C7971 Secondary malignant neoplasm of right adrenal gland: Secondary | ICD-10-CM

## 2017-07-18 DIAGNOSIS — C7972 Secondary malignant neoplasm of left adrenal gland: Secondary | ICD-10-CM

## 2017-07-18 DIAGNOSIS — C3491 Malignant neoplasm of unspecified part of right bronchus or lung: Secondary | ICD-10-CM

## 2017-07-18 LAB — CBC WITH DIFFERENTIAL/PLATELET
BASO%: 0.3 % (ref 0.0–2.0)
BASOS ABS: 0 10*3/uL (ref 0.0–0.1)
EOS ABS: 0 10*3/uL (ref 0.0–0.5)
EOS%: 0.1 % (ref 0.0–7.0)
HEMATOCRIT: 30.3 % — AB (ref 38.4–49.9)
HGB: 9.8 g/dL — ABNORMAL LOW (ref 13.0–17.1)
LYMPH#: 1 10*3/uL (ref 0.9–3.3)
LYMPH%: 9.7 % — ABNORMAL LOW (ref 14.0–49.0)
MCH: 28.3 pg (ref 27.2–33.4)
MCHC: 32.4 g/dL (ref 32.0–36.0)
MCV: 87.3 fL (ref 79.3–98.0)
MONO#: 0.6 10*3/uL (ref 0.1–0.9)
MONO%: 6.3 % (ref 0.0–14.0)
NEUT#: 8.3 10*3/uL — ABNORMAL HIGH (ref 1.5–6.5)
NEUT%: 83.6 % — AB (ref 39.0–75.0)
PLATELETS: 380 10*3/uL (ref 140–400)
RBC: 3.47 10*6/uL — ABNORMAL LOW (ref 4.20–5.82)
RDW: 17.6 % — ABNORMAL HIGH (ref 11.0–14.6)
WBC: 9.9 10*3/uL (ref 4.0–10.3)

## 2017-07-18 LAB — COMPREHENSIVE METABOLIC PANEL
ALT: 14 U/L (ref 0–55)
ANION GAP: 9 meq/L (ref 3–11)
AST: 23 U/L (ref 5–34)
Albumin: 2.1 g/dL — ABNORMAL LOW (ref 3.5–5.0)
Alkaline Phosphatase: 80 U/L (ref 40–150)
BUN: 17.8 mg/dL (ref 7.0–26.0)
CALCIUM: 10 mg/dL (ref 8.4–10.4)
CHLORIDE: 104 meq/L (ref 98–109)
CO2: 25 mEq/L (ref 22–29)
CREATININE: 1 mg/dL (ref 0.7–1.3)
EGFR: 89 mL/min/{1.73_m2} — ABNORMAL LOW (ref >90)
Glucose: 209 mg/dl — ABNORMAL HIGH (ref 70–140)
Potassium: 4 mEq/L (ref 3.5–5.1)
Sodium: 139 mEq/L (ref 136–145)
Total Bilirubin: 0.33 mg/dL (ref 0.20–1.20)
Total Protein: 7.3 g/dL (ref 6.4–8.3)

## 2017-07-18 MED ORDER — CYANOCOBALAMIN 1000 MCG/ML IJ SOLN
1000.0000 ug | Freq: Once | INTRAMUSCULAR | Status: AC
  Administered 2017-07-18: 1000 ug via INTRAMUSCULAR

## 2017-07-18 MED ORDER — SODIUM CHLORIDE 0.9 % IV SOLN
Freq: Once | INTRAVENOUS | Status: AC
  Administered 2017-07-18: 10:00:00 via INTRAVENOUS

## 2017-07-18 MED ORDER — PROCHLORPERAZINE MALEATE 10 MG PO TABS
10.0000 mg | ORAL_TABLET | Freq: Once | ORAL | Status: AC
  Administered 2017-07-18: 10 mg via ORAL

## 2017-07-18 MED ORDER — CYANOCOBALAMIN 1000 MCG/ML IJ SOLN
INTRAMUSCULAR | Status: AC
  Filled 2017-07-18: qty 1

## 2017-07-18 MED ORDER — PROCHLORPERAZINE MALEATE 10 MG PO TABS
ORAL_TABLET | ORAL | Status: AC
  Filled 2017-07-18: qty 1

## 2017-07-18 MED ORDER — SODIUM CHLORIDE 0.9 % IV SOLN
490.0000 mg/m2 | Freq: Once | INTRAVENOUS | Status: AC
  Administered 2017-07-18: 1000 mg via INTRAVENOUS
  Filled 2017-07-18: qty 40

## 2017-07-18 NOTE — Assessment & Plan Note (Signed)
The patient's hemoglobin has improved to 9.8 following a blood transfusion. We will continue to watch his CBC and transfuse as needed.

## 2017-07-18 NOTE — Progress Notes (Signed)
[facility] Follow up:    Jim Mires, MD [address] [place] [address]   DIAGNOSIS: Stage IV (T3, N3, M1b) non-small cell lung cancer, adenocarcinoma with negative EGFR, ALK, ROS 1 and BRAF mutations as well as negative PDL 1 expression diagnosed in February 2018 and presented with large right upper lobe lung mass with mediastinal and left cervical lymphadenopathy as well as metastatic disease to the adrenal glands and bones.  SUMMARY OF ONCOLOGIC HISTORY:  No history exists.   PRIOR THERAPY: Systemic chemotherapy with carboplatin for AUC of 5 and Alimta 500 MG/M2 every 3 weeks. Status post 6cycleswith partial response.  CURRENT THERAPY: Maintenance systemic chemotherapy with single agent Alimta 500 MG/M2 every 3 weeks. First dose 06/19/2017. Status post 1 cycle.  INTERVAL HISTORY: Jim [name] 71 y.o. male returns for routine visit by himself. He was seen for evaluation last week and chemotherapy was held due to anemia and fatigue. He was given 2 units of packed red blood cells and states that he did not really notice much of a difference in how he felt. He still remains weak overall. He still has pain in his knees and is followed by orthopedics. He has ongoing left lower extremity edema which improves when he elevates his legs. Doppler ultrasound was performed last week and was negative for DVT. He denies fevers and chills. Denies chest pain, shortness breath at rest, hemoptysis. He reports mild dyspnea on exertion as well as a nonproductive cough. Denies nausea, vomiting, constipation, diarrhea. He reports that his appetite is fair. He denies any bleeding. The patient is here for evaluation prior to cycle #2 of his maintenance Alimta.   Patient Active Problem List   Diagnosis Date Noted  . Anemia 07/10/2017  . Lower extremity edema 07/10/2017  . Dysuria 01/23/2017  . Adenocarcinoma of right lung, stage 4 ([place]) 01/03/2017  . Goals  of care, counseling/discussion 01/03/2017  . Encounter for antineoplastic chemotherapy 01/03/2017  . Multiple substance abuse 03/09/2011  . Nonischemic cardiomyopathy ([place])   . Kidney disease, chronic, stage III (GFR 30-59 ml/min)   . Hypertension   . CVA (cerebral infarction) 02/06/2011    is allergic to lisinopril and metformin and related.  MEDICAL HISTORY: Past Medical History:  Diagnosis Date  . Adenocarcinoma of right lung, stage 4 ([place]) 01/03/2017  . Alcohol abuse 02/06/2011   with alcohol withdrawal during hospital stay  . Angioedema 02/06/2011   lisinopril  . Cardiomyopathy 02/06/2011   Cardiomyopathy with systolic heart failure, newly diagnosed  . Confusion 02/06/2011   possibly secondary to cerebrovascular accident  versus dementia  . CVA (cerebral infarction) 02/06/2011   Ischemic acute infarct in right anterior cerebral artery territory  . Diabetes mellitus   . Encounter for antineoplastic chemotherapy 01/03/2017  . Goals of care, counseling/discussion 01/03/2017  . Hypertension    Mild LVH per echo  . Kidney disease, chronic, stage III (GFR 30-59 ml/min)   . Left bundle branch block 02/06/2011  . Marijuana abuse 02/06/2011  . Nonischemic cardiomyopathy ([facility])    EF is 30 to 35%  . Periodontal disease   . Periodontal disease    extensive  . Tobacco abuse     SURGICAL HISTORY: Past Surgical History:  Procedure Laterality Date  . US ECHOCARDIOGRAPHY  April 2012   EF 30 to 35 %    SOCIAL HISTORY: Social History   Social History  . Marital status: Single    Spouse name: N/A  .  Number of children: N/A  . Years of education: N/A   Occupational History  . Not on file.   Social History Main Topics  . Smoking status: Former Smoker    Packs/day: 0.50    Types: Cigarettes    Quit date: 01/28/2011  . Smokeless tobacco: Never Used  . Alcohol use Yes     Comment: Moderate  . Drug use: No  . Sexual activity: Not on file   Other Topics Concern  . Not on file    Social History Narrative  . No narrative on file    FAMILY HISTORY: Family History  Problem Relation Age of Onset  . Cancer Mother   . Cancer Father   . Coronary artery disease Other        Grandparents, unspecified    Review of Systems  Constitutional: Positive for fatigue. Negative for appetite change, chills and fever.  HENT:  Negative.   Eyes: Negative.   Respiratory: Positive for cough. Negative for chest tightness and hemoptysis.        Short of breath with exertion.  Cardiovascular: Positive for leg swelling. Negative for chest pain and palpitations.       Left lower extremity edema.  Gastrointestinal: Negative.   Genitourinary: Negative.    Musculoskeletal:       Bilateral knee swelling.  Skin: Negative.   Neurological: Negative.   Hematological: Negative.   Psychiatric/Behavioral: Negative.       PHYSICAL EXAMINATION  ECOG PERFORMANCE STATUS: 2 - Symptomatic, <50% confined to bed  Vitals:   07/18/17 0931  BP: 105/64  Pulse: 88  Resp: 18  Temp: 98.2 F (36.8 C)  SpO2: 96%    Physical Exam  Constitutional: He is oriented to person, place, and time and well-developed, well-nourished, and in no distress. No distress.  HENT:  Head: Normocephalic.  Mouth/Throat: Oropharynx is clear and moist. No oropharyngeal exudate.  Eyes: Conjunctivae are normal. Right eye exhibits no discharge. Left eye exhibits no discharge. No scleral icterus.  Neck: Normal range of motion. Neck supple.  Cardiovascular: Normal rate, regular rhythm, normal heart sounds and intact distal pulses.   Pulmonary/Chest: Effort normal and breath sounds normal. No respiratory distress. He has no wheezes. He has no rales.  Abdominal: Soft. Bowel sounds are normal. He exhibits no distension and no mass. There is no tenderness.  Musculoskeletal: Normal range of motion. He exhibits edema.  Left lower extremity with 2+ edema. Right lower extremity with trace edema.  Lymphadenopathy:    He has  no cervical adenopathy.  Neurological: He is alert and oriented to person, place, and time. He exhibits normal muscle tone.  Skin: Skin is warm and dry. No rash noted. He is not diaphoretic. No erythema. No pallor.  Psychiatric: Mood, memory, affect and judgment normal.  Vitals reviewed.   LABORATORY DATA:  CBC    Component Value Date/Time   WBC 9.9 07/18/2017 0909   WBC 8.7 11/06/2013 1940   RBC 3.47 (L) 07/18/2017 0909   RBC 4.60 11/06/2013 1940   HGB 9.8 (L) 07/18/2017 0909   HCT 30.3 (L) 07/18/2017 0909   PLT 380 07/18/2017 0909   MCV 87.3 07/18/2017 0909   MCH 28.3 07/18/2017 0909   MCH 30.4 11/06/2013 1940   MCHC 32.4 07/18/2017 0909   MCHC 33.8 11/06/2013 1940   RDW 17.6 (H) 07/18/2017 0909   LYMPHSABS 1.0 07/18/2017 0909   MONOABS 0.6 07/18/2017 0909   EOSABS 0.0 07/18/2017 0909   BASOSABS 0.0 07/18/2017 0240  CMP     Component Value Date/Time   NA 139 07/18/2017 0909   K 4.0 07/18/2017 0909   CL 99 11/06/2013 1940   CO2 25 07/18/2017 0909   GLUCOSE 209 (H) 07/18/2017 0909   BUN 17.8 07/18/2017 0909   CREATININE 1.0 07/18/2017 0909   CALCIUM 10.0 07/18/2017 0909   PROT 7.3 07/18/2017 0909   ALBUMIN 2.1 (L) 07/18/2017 0909   AST 23 07/18/2017 0909   ALT 14 07/18/2017 0909   ALKPHOS 80 07/18/2017 0909   BILITOT 0.33 07/18/2017 0909   GFRNONAA 75 (L) 11/06/2013 1940   GFRAA 87 (L) 11/06/2013 1940    RADIOGRAPHIC STUDIES:  No results found.   ASSESSMENT and THERAPY PLAN:   Adenocarcinoma of right lung, stage 4 ([place]) This is a very pleasant 71year old African-American male with stage IV non-small cell lung cancer, adenocarcinoma with no actionable mutations and currently undergoing systemic chemotherapy was carboplatin and Alimta status post 6cycles. He tolerated the last cycle of his treatment fairly well. The patient had repeat CT scan of the chest, abdomen and pelvis performed after cycle #6 and it showed interval decrease in the size of the  right lung mass and surrounding drowned/obstructed lung. There was no evidence for disease progression.  The patient is now receiving maintenance therapy with single agent Alimta. His second cycle of chemotherapy was delayed by 1 week due to fatigue, anemia, and weakness following his first cycle.  The patient is feeling somewhat stronger this week and would like to proceed. The patient will proceed with cycle 2 of maintenance Alimta today as scheduled.  For pain management, he will continue on tramadol.  Referral has made to home health for evaluation for services.  He patient will return in 3 weeks for evaluation prior to cycle 3 of his chemotherapy.  The patient voices understanding of current disease status and treatment options and is in agreement with the current care plan.  Anemia The patient's hemoglobin has improved to 9.8 following a blood transfusion. We will continue to watch his CBC and transfuse as needed.   No orders of the defined types were placed in this encounter.   All questions were answered. The patient knows to call the clinic with any problems, questions or concerns. We can certainly see the patient much sooner if necessary.  Mikey Bussing, NP 07/18/2017

## 2017-07-18 NOTE — Patient Instructions (Signed)
[facility] Discharge Instructions for Patients Receiving Chemotherapy  Today you received the following chemotherapy agents; Alimta.   To help prevent nausea and vomiting after your treatment, we encourage you to take your nausea medication as directed.    If you develop nausea and vomiting that is not controlled by your nausea medication, call the clinic.   BELOW ARE SYMPTOMS THAT SHOULD BE REPORTED IMMEDIATELY:  *FEVER GREATER THAN 100.5 F  *CHILLS WITH OR WITHOUT FEVER  NAUSEA AND VOMITING THAT IS NOT CONTROLLED WITH YOUR NAUSEA MEDICATION  *UNUSUAL SHORTNESS OF BREATH  *UNUSUAL BRUISING OR BLEEDING  TENDERNESS IN MOUTH AND THROAT WITH OR WITHOUT PRESENCE OF ULCERS  *URINARY PROBLEMS  *BOWEL PROBLEMS  UNUSUAL RASH Items with * indicate a potential emergency and should be followed up as soon as possible.  Feel free to call the clinic you have any questions or concerns. The clinic phone number is [phone].  Please show the CHEMO ALERT CARD at check-in to the Emergency Department and triage nurse.   

## 2017-07-18 NOTE — Assessment & Plan Note (Signed)
This is a very pleasant 71year old African-American male with stage IV non-small cell lung cancer, adenocarcinoma with no actionable mutations and currently undergoing systemic chemotherapy was carboplatin and Alimta status post 6cycles. He tolerated the last cycle of his treatment fairly well. The patient had repeat CT scan of the chest, abdomen and pelvis performed after cycle #6 and it showed interval decrease in the size of the right lung mass and surrounding drowned/obstructed lung. There was no evidence for disease progression.  The patient is now receiving maintenance therapy with single agent Alimta. His second cycle of chemotherapy was delayed by 1 week due to fatigue, anemia, and weakness following his first cycle.  The patient is feeling somewhat stronger this week and would like to proceed. The patient will proceed with cycle 2 of maintenance Alimta today as scheduled.  For pain management, he will continue on tramadol.  Referral has made to home health for evaluation for services.  He patient will return in 3 weeks for evaluation prior to cycle 3 of his chemotherapy.  The patient voices understanding of current disease status and treatment options and is in agreement with the current care plan.

## 2017-07-18 NOTE — Telephone Encounter (Signed)
Referred pt to Home Health. Notes and order faxed to [facility]

## 2017-07-23 ENCOUNTER — Telehealth: Payer: Self-pay | Admitting: Medical Oncology

## 2017-07-23 NOTE — Telephone Encounter (Signed)
I left message for [facility] that Julien Nordmann will sign orders for pt.

## 2017-07-26 ENCOUNTER — Telehealth: Payer: Self-pay

## 2017-07-26 NOTE — Telephone Encounter (Signed)
Merry Proud called for VO HH PT 2x/wk for 4 weeks, 1 x/wk for 4 weeks. Gait balance and strength training. VO given per Dr Julien Nordmann.

## 2017-07-28 NOTE — Progress Notes (Signed)
Encounter created in error. Please disregard.

## 2017-08-07 ENCOUNTER — Encounter: Payer: Self-pay | Admitting: Internal Medicine

## 2017-08-07 ENCOUNTER — Other Ambulatory Visit (HOSPITAL_BASED_OUTPATIENT_CLINIC_OR_DEPARTMENT_OTHER): Payer: Medicare HMO

## 2017-08-07 ENCOUNTER — Ambulatory Visit (HOSPITAL_BASED_OUTPATIENT_CLINIC_OR_DEPARTMENT_OTHER): Payer: Medicare HMO

## 2017-08-07 ENCOUNTER — Ambulatory Visit (HOSPITAL_BASED_OUTPATIENT_CLINIC_OR_DEPARTMENT_OTHER): Payer: Medicare HMO | Admitting: Internal Medicine

## 2017-08-07 VITALS — BP 94/57 | HR 92 | Temp 99.2°F | Resp 18 | Ht 71.0 in | Wt 180.0 lb

## 2017-08-07 DIAGNOSIS — R0981 Nasal congestion: Secondary | ICD-10-CM

## 2017-08-07 DIAGNOSIS — C7971 Secondary malignant neoplasm of right adrenal gland: Secondary | ICD-10-CM

## 2017-08-07 DIAGNOSIS — C3411 Malignant neoplasm of upper lobe, right bronchus or lung: Secondary | ICD-10-CM

## 2017-08-07 DIAGNOSIS — C7972 Secondary malignant neoplasm of left adrenal gland: Secondary | ICD-10-CM | POA: Diagnosis not present

## 2017-08-07 DIAGNOSIS — Z5111 Encounter for antineoplastic chemotherapy: Secondary | ICD-10-CM | POA: Diagnosis not present

## 2017-08-07 DIAGNOSIS — R600 Localized edema: Secondary | ICD-10-CM

## 2017-08-07 DIAGNOSIS — C3490 Malignant neoplasm of unspecified part of unspecified bronchus or lung: Secondary | ICD-10-CM

## 2017-08-07 DIAGNOSIS — I10 Essential (primary) hypertension: Secondary | ICD-10-CM | POA: Diagnosis not present

## 2017-08-07 DIAGNOSIS — C3491 Malignant neoplasm of unspecified part of right bronchus or lung: Secondary | ICD-10-CM

## 2017-08-07 DIAGNOSIS — R530 Neoplastic (malignant) related fatigue: Secondary | ICD-10-CM | POA: Diagnosis not present

## 2017-08-07 DIAGNOSIS — R070 Pain in throat: Secondary | ICD-10-CM | POA: Diagnosis not present

## 2017-08-07 DIAGNOSIS — C7951 Secondary malignant neoplasm of bone: Secondary | ICD-10-CM

## 2017-08-07 LAB — CBC WITH DIFFERENTIAL/PLATELET
BASO%: 0.7 % (ref 0.0–2.0)
BASOS ABS: 0.1 10*3/uL (ref 0.0–0.1)
EOS%: 0.8 % (ref 0.0–7.0)
Eosinophils Absolute: 0.1 10*3/uL (ref 0.0–0.5)
HCT: 25.5 % — ABNORMAL LOW (ref 38.4–49.9)
HGB: 8.1 g/dL — ABNORMAL LOW (ref 13.0–17.1)
LYMPH%: 11 % — AB (ref 14.0–49.0)
MCH: 27.5 pg (ref 27.2–33.4)
MCHC: 31.7 g/dL — AB (ref 32.0–36.0)
MCV: 86.9 fL (ref 79.3–98.0)
MONO#: 1.1 10*3/uL — ABNORMAL HIGH (ref 0.1–0.9)
MONO%: 11.1 % (ref 0.0–14.0)
NEUT#: 7.6 10*3/uL — ABNORMAL HIGH (ref 1.5–6.5)
NEUT%: 76.4 % — AB (ref 39.0–75.0)
Platelets: 661 10*3/uL — ABNORMAL HIGH (ref 140–400)
RBC: 2.93 10*6/uL — AB (ref 4.20–5.82)
RDW: 19.3 % — AB (ref 11.0–14.6)
WBC: 9.9 10*3/uL (ref 4.0–10.3)
lymph#: 1.1 10*3/uL (ref 0.9–3.3)

## 2017-08-07 LAB — COMPREHENSIVE METABOLIC PANEL
ALT: 14 U/L (ref 0–55)
AST: 23 U/L (ref 5–34)
Albumin: 1.8 g/dL — ABNORMAL LOW (ref 3.5–5.0)
Alkaline Phosphatase: 69 U/L (ref 40–150)
Anion Gap: 8 mEq/L (ref 3–11)
BUN: 11.5 mg/dL (ref 7.0–26.0)
CHLORIDE: 105 meq/L (ref 98–109)
CO2: 25 mEq/L (ref 22–29)
Calcium: 9.4 mg/dL (ref 8.4–10.4)
Creatinine: 0.9 mg/dL (ref 0.7–1.3)
EGFR: >60 mL/min/{1.73_m2} (ref >60)
GLUCOSE: 226 mg/dL — AB (ref 70–140)
POTASSIUM: 4 meq/L (ref 3.5–5.1)
SODIUM: 139 meq/L (ref 136–145)
Total Bilirubin: 0.39 mg/dL (ref 0.20–1.20)
Total Protein: 6.6 g/dL (ref 6.4–8.3)

## 2017-08-07 MED ORDER — PROCHLORPERAZINE MALEATE 10 MG PO TABS
ORAL_TABLET | ORAL | Status: AC
  Filled 2017-08-07: qty 1

## 2017-08-07 MED ORDER — PROCHLORPERAZINE MALEATE 10 MG PO TABS
10.0000 mg | ORAL_TABLET | Freq: Once | ORAL | Status: AC
  Administered 2017-08-07: 10 mg via ORAL

## 2017-08-07 MED ORDER — SODIUM CHLORIDE 0.9 % IV SOLN
490.0000 mg/m2 | Freq: Once | INTRAVENOUS | Status: AC
  Administered 2017-08-07: 1000 mg via INTRAVENOUS
  Filled 2017-08-07: qty 40

## 2017-08-07 MED ORDER — SODIUM CHLORIDE 0.9 % IV SOLN
Freq: Once | INTRAVENOUS | Status: AC
  Administered 2017-08-07: 12:00:00 via INTRAVENOUS

## 2017-08-07 NOTE — Patient Instructions (Signed)
[facility] Discharge Instructions for Patients Receiving Chemotherapy  Today you received the following chemotherapy agents Alimta  To help prevent nausea and vomiting after your treatment, we encourage you to take your nausea medication as directed   If you develop nausea and vomiting that is not controlled by your nausea medication, call the clinic.   BELOW ARE SYMPTOMS THAT SHOULD BE REPORTED IMMEDIATELY:  *FEVER GREATER THAN 100.5 F  *CHILLS WITH OR WITHOUT FEVER  NAUSEA AND VOMITING THAT IS NOT CONTROLLED WITH YOUR NAUSEA MEDICATION  *UNUSUAL SHORTNESS OF BREATH  *UNUSUAL BRUISING OR BLEEDING  TENDERNESS IN MOUTH AND THROAT WITH OR WITHOUT PRESENCE OF ULCERS  *URINARY PROBLEMS  *BOWEL PROBLEMS  UNUSUAL RASH Items with * indicate a potential emergency and should be followed up as soon as possible.  Feel free to call the clinic should you have any questions or concerns. The clinic phone number is [phone].  Please show the [place] at check-in to the Emergency Department and triage nurse.

## 2017-08-07 NOTE — Progress Notes (Signed)
[facility] [phone]   [phone]  OFFICE PROGRESS NOTE  [name], MD [address] [place] [address]  DIAGNOSIS: Stage IV (T3, N3, M1b) non-small cell lung cancer, adenocarcinoma with negative EGFR, ALK, ROS 1 and BRAF mutations as well as negative PDL 1 expression diagnosed in February 2018 and presented with large right upper lobe lung mass with mediastinal and left cervical lymphadenopathy as well as metastatic disease to the adrenal glands and bones.  PRIOR THERAPY:  Systemic chemotherapy with carboplatin for AUC of 5 and Alimta 500 MG/M2 every 3 weeks. Status post 6 cycles with partial response.  CURRENT THERAPY: Maintenance systemic chemotherapy with single agent Alimta 500 MG/M2 every 3 weeks. First dose 06/19/2017. Status post 2 cycles.  INTERVAL HISTORY: Jim [name] 71 y.o. male returns to the clinic today for follow-up visit. The patient continues to complain of increasing fatigue and weakness as well as swelling of the lower extremities. He had Doppler of the lower extremities that showed no evidence for DVT thrombosis. He denied having any chest pain that has shortness breath with exertion with no cough or hemoptysis. He also has intermittent mid back pain. He denied having any fever or chills. He has no nausea, vomiting, diarrhea or constipation. He continues to have nasal congestion and intermittent sore throat. The patient is here today for evaluation before starting cycle #3 of his maintenance therapy.   MEDICAL HISTORY: Past Medical History:  Diagnosis Date  . Adenocarcinoma of right lung, stage 4 ([facility]) 01/03/2017  . Alcohol abuse 02/06/2011   with alcohol withdrawal during hospital stay  . Angioedema 02/06/2011   lisinopril  . Cardiomyopathy 02/06/2011   Cardiomyopathy with systolic heart failure, newly diagnosed  . Confusion 02/06/2011   possibly secondary to cerebrovascular accident  versus  dementia  . CVA (cerebral infarction) 02/06/2011   Ischemic acute infarct in right anterior cerebral artery territory  . Diabetes mellitus   . Encounter for antineoplastic chemotherapy 01/03/2017  . Goals of care, counseling/discussion 01/03/2017  . Hypertension    Mild LVH per echo  . Kidney disease, chronic, stage III (GFR 30-59 ml/min)   . Left bundle branch block 02/06/2011  . Marijuana abuse 02/06/2011  . Nonischemic cardiomyopathy (HCC)    EF is 30 to 35%  . Periodontal disease   . Periodontal disease    extensive  . Tobacco abuse     ALLERGIES:  is allergic to lisinopril and metformin and related.  MEDICATIONS:  Current Outpatient Prescriptions  Medication Sig Dispense Refill  . albuterol (PROVENTIL HFA;VENTOLIN HFA) 108 (90 Base) MCG/ACT inhaler Inhale 1 puff into the lungs every 6 (six) hours as needed.    Marland Kitchen aspirin 81 MG tablet Take 81 mg by mouth daily.      Marland Kitchen buPROPion (WELLBUTRIN SR) 100 MG 12 hr tablet Take 100 mg by mouth 2 (two) times daily.    . carvedilol (COREG) 12.5 MG tablet Take 1 tablet by mouth daily.    . cetirizine (ZYRTEC) 10 MG tablet Take 10 mg by mouth daily.    . clopidogrel (PLAVIX) 75 MG tablet Take 75 mg by mouth daily with breakfast.    . dexamethasone (DECADRON) 4 MG tablet 4 mg by mouth twice a day the day before, day of and day after the chemotherapy every 3 weeks 40 tablet 1  . diphenhydrAMINE (BENADRYL) 25 MG tablet Take 1 tablet (25 mg total) by mouth every 6 (six)  hours. (Patient not taking: Reported on 07/18/2017) 20 tablet 0  . famotidine (PEPCID) 20 MG tablet Take 1 tablet (20 mg total) by mouth 2 (two) times daily. 10 tablet 0  . fluticasone (FLONASE) 50 MCG/ACT nasal spray Place 1 spray into both nostrils daily.  0  . folic acid (FOLVITE) 1 MG tablet take 1 tablet by mouth once daily 30 tablet 4  . hydrALAZINE (APRESOLINE) 25 MG tablet Take 1 tablet (25 mg total) by mouth 2 (two) times daily. 120 tablet 11  . hydrochlorothiazide (HYDRODIURIL)  25 MG tablet Take 25 mg by mouth daily.    Marland Kitchen HYDROcodone-acetaminophen (NORCO/VICODIN) 5-325 MG tablet Take 1 tablet by mouth every 8 (eight) hours as needed.  0  . Insulin Glargine (LANTUS) 100 UNIT/ML Solostar Pen Inject 14 Units into the skin 2 (two) times daily. Inject 28 units daily    . isosorbide mononitrate (IMDUR) 30 MG 24 hr tablet TAKE 1 TABLET BY MOUTH DAILY    . multivitamin (THERAGRAN) per tablet Take 1 tablet by mouth daily.      . nitroGLYCERIN (NITROSTAT) 0.4 MG SL tablet Place 0.4 mg under the tongue every 5 (five) minutes as needed for chest pain.     Marland Kitchen prochlorperazine (COMPAZINE) 10 MG tablet Take 1 tablet (10 mg total) by mouth every 6 (six) hours as needed for nausea or vomiting. (Patient not taking: Reported on 07/18/2017) 30 tablet 0  . rosuvastatin (CRESTOR) 10 MG tablet Take 10 mg by mouth daily.      . tamsulosin (FLOMAX) 0.4 MG CAPS capsule     . traMADol (ULTRAM) 50 MG tablet Take 50 mg by mouth every 8 (eight) hours as needed for moderate pain.    . traZODone (DESYREL) 50 MG tablet Take 50 mg by mouth at bedtime. Take 1/2 to 1 tablet hs     No current facility-administered medications for this visit.     SURGICAL HISTORY:  Past Surgical History:  Procedure Laterality Date  . US ECHOCARDIOGRAPHY  April 2012   EF 30 to 35 %    REVIEW OF SYSTEMS:  Constitutional: positive for fatigue Eyes: negative Ears, nose, mouth, throat, and face: negative Respiratory: positive for wheezing Cardiovascular: negative Gastrointestinal: negative Genitourinary:negative Integument/breast: negative Hematologic/lymphatic: negative Musculoskeletal:positive for muscle weakness Neurological: negative Behavioral/Psych: negative Endocrine: negative Allergic/Immunologic: negative   PHYSICAL EXAMINATION: General appearance: alert, cooperative, fatigued and no distress Head: Normocephalic, without obvious abnormality, atraumatic Neck: no adenopathy, no JVD, supple, symmetrical,  trachea midline and thyroid not enlarged, symmetric, no tenderness/mass/nodules Lymph nodes: Cervical, supraclavicular, and axillary nodes normal. Resp: clear to auscultation bilaterally Back: symmetric, no curvature. ROM normal. No CVA tenderness. Cardio: regular rate and rhythm, S1, S2 normal, no murmur, click, rub or gallop GI: soft, non-tender; bowel sounds normal; no masses,  no organomegaly Extremities: edema 2+ Neurologic: Alert and oriented X 3, normal strength and tone. Normal symmetric reflexes. Normal coordination and gait  ECOG PERFORMANCE STATUS: 1 - Symptomatic but completely ambulatory  Blood pressure (!) 94/57, pulse 92, temperature 99.2 F (37.3 C), temperature source Oral, resp. rate 18, height 5' 11"  (1.803 m), weight 180 lb (81.6 kg), SpO2 100 %.  LABORATORY DATA: Lab Results  Component Value Date   WBC 9.9 08/07/2017   HGB 8.1 (L) 08/07/2017   HCT 25.5 (L) 08/07/2017   MCV 86.9 08/07/2017   PLT 661 (H) 08/07/2017      Chemistry      Component Value Date/Time   NA 139 08/07/2017 0926  K 4.0 08/07/2017 0926   CL 99 11/06/2013 1940   CO2 25 08/07/2017 0926   BUN 11.5 08/07/2017 0926   CREATININE 0.9 08/07/2017 0926      Component Value Date/Time   CALCIUM 9.4 08/07/2017 0926   ALKPHOS 69 08/07/2017 0926   AST 23 08/07/2017 0926   ALT 14 08/07/2017 0926   BILITOT 0.39 08/07/2017 0926       RADIOGRAPHIC STUDIES: No results found.  ASSESSMENT AND PLAN:  This is a very pleasant 71 years old African-American male with stage IV non-small cell lung cancer, adenocarcinoma with no actionable mutations and currently undergoing systemic chemotherapy with carboplatin and Alimta status post 6 cycles. The patient is currently on maintenance treatment with single agent Alimta status post 2 cycles and has been tolerating this treatment fairly well except for fatigue. I recommended for the patient to proceed with cycle #3 today as scheduled. For the lower  extremity swelling, I will start the patient on Lasix 20 mg by mouth daily for the next 7 days then on as-needed basis. He was also advised to increase his potassium rich diet. I will see the patient back for follow-up visit in 3 weeks for evaluation after repeating CT scan of the chest, abdomen and pelvis for restaging of his disease. For hypertension, the patient will continue his current treatment with Coreg and hydrochlorothiazide. For pain management, he will continue on tramadol. The patient voices understanding of current disease status and treatment options and is in agreement with the current care plan. All questions were answered. The patient knows to call the clinic with any problems, questions or concerns. We can certainly see the patient much sooner if necessary.  Disclaimer: This note was dictated with voice recognition software. Similar sounding words can inadvertently be transcribed and may not be corrected upon review.

## 2017-08-09 ENCOUNTER — Encounter: Payer: Self-pay | Admitting: *Deleted

## 2017-08-09 NOTE — Progress Notes (Signed)
[place] Work  Clinical Social Work was referred by medical oncology RN for assessment of psychosocial needs; specifically home care or assisted living needs.  Clinical Social Worker met with patient in infusion room to offer support and assess for needs.  Mr. Bunton shared he has difficulty with ADLs at home.  CSW provided information on going to an ALF, patient did not feel that was suitable at this time due to the costs.  He is unsure if he has full Medicaid benefits for home personal care services.  The patient agreed to contact his Medicaid caseworker to inquire about his benefits and will follow up with CSW afterwards.   Maryjean Morn, MSW, LCSW, OSW-C Clinical Social Worker [facility] [phone]

## 2017-08-14 ENCOUNTER — Other Ambulatory Visit: Payer: Self-pay | Admitting: Medical Oncology

## 2017-08-14 ENCOUNTER — Telehealth: Payer: Self-pay | Admitting: *Deleted

## 2017-08-14 ENCOUNTER — Ambulatory Visit (HOSPITAL_BASED_OUTPATIENT_CLINIC_OR_DEPARTMENT_OTHER): Payer: Medicare HMO | Admitting: Medical

## 2017-08-14 ENCOUNTER — Ambulatory Visit (HOSPITAL_COMMUNITY)
Admission: RE | Admit: 2017-08-14 | Discharge: 2017-08-14 | Disposition: A | Discharge status: Home / Self Care | Payer: Medicare HMO | Source: Ambulatory Visit | Attending: Internal Medicine | Admitting: Internal Medicine

## 2017-08-14 ENCOUNTER — Other Ambulatory Visit: Payer: Self-pay | Admitting: *Deleted

## 2017-08-14 ENCOUNTER — Telehealth: Payer: Self-pay | Admitting: Internal Medicine

## 2017-08-14 ENCOUNTER — Ambulatory Visit (HOSPITAL_BASED_OUTPATIENT_CLINIC_OR_DEPARTMENT_OTHER): Payer: Medicare HMO

## 2017-08-14 VITALS — BP 110/68 | HR 110 | Temp 98.4°F | Resp 18 | Wt 171.2 lb

## 2017-08-14 DIAGNOSIS — C7972 Secondary malignant neoplasm of left adrenal gland: Secondary | ICD-10-CM | POA: Diagnosis not present

## 2017-08-14 DIAGNOSIS — Z87438 Personal history of other diseases of male genital organs: Secondary | ICD-10-CM

## 2017-08-14 DIAGNOSIS — C3411 Malignant neoplasm of upper lobe, right bronchus or lung: Secondary | ICD-10-CM

## 2017-08-14 DIAGNOSIS — C3491 Malignant neoplasm of unspecified part of right bronchus or lung: Secondary | ICD-10-CM

## 2017-08-14 DIAGNOSIS — T451X5A Adverse effect of antineoplastic and immunosuppressive drugs, initial encounter: Secondary | ICD-10-CM

## 2017-08-14 DIAGNOSIS — D6481 Anemia due to antineoplastic chemotherapy: Secondary | ICD-10-CM | POA: Diagnosis not present

## 2017-08-14 DIAGNOSIS — C7951 Secondary malignant neoplasm of bone: Secondary | ICD-10-CM

## 2017-08-14 DIAGNOSIS — C7971 Secondary malignant neoplasm of right adrenal gland: Secondary | ICD-10-CM | POA: Diagnosis not present

## 2017-08-14 DIAGNOSIS — R300 Dysuria: Secondary | ICD-10-CM

## 2017-08-14 DIAGNOSIS — D649 Anemia, unspecified: Secondary | ICD-10-CM | POA: Diagnosis not present

## 2017-08-14 LAB — URINALYSIS, MICROSCOPIC - CHCC
Bilirubin (Urine): NEGATIVE
Blood: NEGATIVE
GLUCOSE UR CHCC: NEGATIVE mg/dL
Ketones: 15 mg/dL
Nitrite: NEGATIVE
PROTEIN: 30 mg/dL
SPECIFIC GRAVITY, URINE: 1.005 (ref 1.003–1.035)
UROBILINOGEN UR: 0.2 mg/dL (ref 0.2–1)
pH: 7.5 (ref 4.6–8.0)

## 2017-08-14 LAB — CBC WITH DIFFERENTIAL/PLATELET
BASO%: 0.4 % (ref 0.0–2.0)
Basophils Absolute: 0 10*3/uL (ref 0.0–0.1)
EOS%: 0.5 % (ref 0.0–7.0)
Eosinophils Absolute: 0 10*3/uL (ref 0.0–0.5)
HEMATOCRIT: 24.4 % — AB (ref 38.4–49.9)
HGB: 7.8 g/dL — ABNORMAL LOW (ref 13.0–17.1)
LYMPH#: 1 10*3/uL (ref 0.9–3.3)
LYMPH%: 16.4 % (ref 14.0–49.0)
MCH: 27.5 pg (ref 27.2–33.4)
MCHC: 32.1 g/dL (ref 32.0–36.0)
MCV: 85.7 fL (ref 79.3–98.0)
MONO#: 0.7 10*3/uL (ref 0.1–0.9)
MONO%: 12.2 % (ref 0.0–14.0)
NEUT%: 70.5 % (ref 39.0–75.0)
NEUTROS ABS: 4.3 10*3/uL (ref 1.5–6.5)
PLATELETS: 341 10*3/uL (ref 140–400)
RBC: 2.85 10*6/uL — AB (ref 4.20–5.82)
RDW: 19.1 % — ABNORMAL HIGH (ref 11.0–14.6)
WBC: 6.1 10*3/uL (ref 4.0–10.3)

## 2017-08-14 LAB — COMPREHENSIVE METABOLIC PANEL
ALT: 14 U/L (ref 0–55)
ANION GAP: 11 meq/L (ref 3–11)
AST: 22 U/L (ref 5–34)
Albumin: 2.1 g/dL — ABNORMAL LOW (ref 3.5–5.0)
Alkaline Phosphatase: 75 U/L (ref 40–150)
BUN: 15.2 mg/dL (ref 7.0–26.0)
CALCIUM: 9.8 mg/dL (ref 8.4–10.4)
CHLORIDE: 99 meq/L (ref 98–109)
CO2: 27 meq/L (ref 22–29)
CREATININE: 0.9 mg/dL (ref 0.7–1.3)
EGFR: >60 mL/min/{1.73_m2} (ref >60)
Glucose: 139 mg/dl (ref 70–140)
POTASSIUM: 3.6 meq/L (ref 3.5–5.1)
Sodium: 138 mEq/L (ref 136–145)
Total Bilirubin: 0.73 mg/dL (ref 0.20–1.20)
Total Protein: 6.8 g/dL (ref 6.4–8.3)

## 2017-08-14 LAB — PREPARE RBC (CROSSMATCH)

## 2017-08-14 MED ORDER — FUROSEMIDE 10 MG/ML IJ SOLN: 20.0000 mg | Freq: Once | INTRAMUSCULAR | Status: DC

## 2017-08-14 MED ORDER — SODIUM CHLORIDE 0.9 % IV SOLN: 250.0000 mL | Freq: Once | INTRAVENOUS | Status: DC

## 2017-08-14 MED ORDER — ACETAMINOPHEN 325 MG PO TABS: 650.0000 mg | ORAL_TABLET | Freq: Once | ORAL | Status: DC

## 2017-08-14 MED ORDER — DIPHENHYDRAMINE HCL 25 MG PO CAPS: 25.0000 mg | ORAL_CAPSULE | Freq: Once | ORAL | Status: DC

## 2017-08-14 NOTE — Telephone Encounter (Signed)
Scheduled appts with smc per 10/17 sch msg. Patient is aware of the times and coming in now.

## 2017-08-14 NOTE — Telephone Encounter (Signed)
At this time patient on his way for symptom management.  "I'm going through some things that area not normal such as:   Chills with or without fever.     Nausea.  Unusual shortness of breath, tenderness in mouth and throat with or without presence of ulcers.  Problems with urination, frequency and burning problems.  Bowel problems. You can reach me at [phone]."

## 2017-08-14 NOTE — Progress Notes (Signed)
Symptoms Management Clinic Progress Note   Jim [name] [id number] 11/17/1945 71 y.o.  Jim [name] is managed by Dr. Eilleen Kempf  Actively treated with chemotherapy: yes  Current Therapy: Alimta   Last Treated: 08/07/2017  Assessment: Plan:    Anemia associated with chemotherapy - Plan: Practitioner attestation of consent, Complete patient signature process for consent form, Type and screen, Prepare RBC, Transfuse RBC, Care order/instruction, 0.9 %  sodium chloride infusion, furosemide (LASIX) injection 20 mg, acetaminophen (TYLENOL) tablet 650 mg, diphenhydrAMINE (BENADRYL) capsule 25 mg, Prepare RBC, CANCELED: Practitioner attestation of consent, CANCELED: Complete patient signature process for consent form, CANCELED: Care order/instruction, CANCELED: Type and screen, CANCELED: Prepare RBC, CANCELED: Transfuse RBC  Adenocarcinoma of right lung, stage 4 (HCC) - Plan: CANCELED: CBC with Differential, CANCELED: CBC with Differential  Dysuria  History of prostatitis   Anemia associated with chemotherapy:. A CBC return today with a hemoglobin of 7.8 and hematocrit of 24.4. The patient will return on Friday, 08/16/2017 for 2 units of packed red blood cells given through the sickle cell clinic.  Adenocarcinoma of the right lung, stage IV: The patient completed cycle 3 of maintenance Alimta on 08/07/2017. The patient is scheduled to have a restaging CT scan completed on 08/27/2017 and will return to see Dr. Julien Nordmann on 08/28/2017.  Dysuria and history of prostatitis. I have instructed the patient to follow-up with his primary care provider Dr. Doreene Nest regarding his symptoms.  Please see After Visit Summary for patient specific instructions.  Future Appointments Date Time Provider [place]  08/16/2017 8:30 AM [facility] RM 1 [facility] None  08/27/2017 12:30 PM [facility] 2 [facility] [place]  08/28/2017 10:00 AM [facility] LAB 2 [facility] None  08/28/2017 10:30 AM  Curt Bears, MD [facility] None  08/28/2017 12:00 PM [facility] C8 [facility] None  09/18/2017 10:00 AM [facility] LAB 5 [facility] None  09/18/2017 10:30 AM Curt Bears, MD [facility] None  09/18/2017 11:30 AM [facility] E14 [facility] None  10/09/2017 10:30 AM [facility] LAB 2 [facility] None  10/09/2017 11:00 AM Curt Bears, MD [facility] None  10/09/2017 12:00 PM [facility] C8 [facility] None    Orders Placed This Encounter  Procedures  . Practitioner attestation of consent  . Complete patient signature process for consent form  . Care order/instruction  . Type and screen  . Prepare RBC       Subjective:   Patient ID:  Jim [name] is a 71 y.o. (DOB 19-Sep-1946) male.  Chief Complaint:  Chief Complaint  Patient presents with  . Dysuria    and SOB    HPI Jim [name] is a 71 year old male with a history of a stage IV (T3, N3, M1b) non-small cell lung cancer, adenocarcinoma with a large right upper lobe mass, mediastinal and left cervical lymphadenopathy and metastatic disease to the adrenal glands and bones. He was treated with carboplatin for AUC of 5 and Alimta 500 mg/m squared every 3 weeks. He is status post 6 cycles with a partial response. He is currently being treated with maintenance Alimta and is status post 3 cycles with his last cycle received on 08/07/2017. Jim [name] contact the office this morning for an appointment stating that he frequency, urgency and dysuria. He is up multiple times throughout the evening to urinate. He reports shortness of breath, fatigue, and haziness of his vision. He has not taken his insulin today and has not taken any of his other medications. He has not checked his bl glucometer is  broken and has been so for the last 4-5 days. He is awaiting a new glucometer. He did take insulin last night but took half of his regular dose. The patient has had a history of prostatitis but has not been seen by anyone other  than his primary care provider.  Medications: I have reviewed the patient's current medications.  Allergies:  Allergies  Allergen Reactions  . Lisinopril Swelling    lip Angioedema  . Metformin And Related Other (See Comments)    Blood sugar went up    Past Medical History:  Diagnosis Date  . Adenocarcinoma of right lung, stage 4 ([place]) 01/03/2017  . Alcohol abuse 02/06/2011   with alcohol withdrawal during hospital stay  . Angioedema 02/06/2011   lisinopril  . Cardiomyopathy 02/06/2011   Cardiomyopathy with systolic heart failure, newly diagnosed  . Confusion 02/06/2011   possibly secondary to cerebrovascular accident  versus dementia  . CVA (cerebral infarction) 02/06/2011   Ischemic acute infarct in right anterior cerebral artery territory  . Diabetes mellitus   . Encounter for antineoplastic chemotherapy 01/03/2017  . Goals of care, counseling/discussion 01/03/2017  . Hypertension    Mild LVH per echo  . Kidney disease, chronic, stage III (GFR 30-59 ml/min) (HCC)   . Left bundle branch block 02/06/2011  . Marijuana abuse 02/06/2011  . Nonischemic cardiomyopathy (HCC)    EF is 30 to 35%  . Periodontal disease   . Periodontal disease    extensive  . Tobacco abuse     Past Surgical History:  Procedure Laterality Date  . US ECHOCARDIOGRAPHY  April 2012   EF 30 to 35 %    Family History  Problem Relation Age of Onset  . Cancer Mother   . Cancer Father   . Coronary artery disease Other        Grandparents, unspecified    Social History   Social History  . Marital status: Single    Spouse name: N/A  . Number of children: N/A  . Years of education: N/A   Occupational History  . Not on file.   Social History Main Topics  . Smoking status: Former Smoker    Packs/day: 0.50    Types: Cigarettes    Quit date: 01/28/2011  . Smokeless tobacco: Never Used  . Alcohol use Yes     Comment: Moderate  . Drug use: No  . Sexual activity: Not on file   Other Topics Concern    . Not on file   Social History Narrative  . No narrative on file    Past Medical History, Surgical history, Social history, and Family history were reviewed and updated as appropriate.   Please see review of systems for further details on the patient's review from today.   Review of Systems:  Review of Systems  Constitutional: Positive for chills and fatigue. Negative for diaphoresis and fever.  Eyes: Positive for visual disturbance.  Respiratory: Positive for shortness of breath.   Genitourinary: Positive for dysuria, frequency and urgency. Negative for decreased urine volume and difficulty urinating.    Objective:   Physical Exam:  BP 110/68 (BP Location: Right Arm, Patient Position: Sitting)   Pulse (!) 110   Temp 98.4 F (36.9 C) (Oral)   Resp 18   Wt 171 lb 3 oz (77.7 kg)   SpO2 100%   BMI 23.88 kg/m  ECOG: 1  Physical Exam  Constitutional: No distress.  HENT:  Head: Normocephalic and atraumatic.  Cardiovascular: Normal rate, regular  rhythm and normal heart sounds.  Exam reveals no gallop and no friction rub.   No murmur heard. Pulmonary/Chest: Effort normal and breath sounds normal. No respiratory distress. He has no wheezes. He has no rales.  Abdominal: Soft. Bowel sounds are normal. He exhibits no distension. There is no tenderness. There is no rebound and no guarding.  Genitourinary: Rectum normal.  Genitourinary Comments: The patient's prostate was boggy and slightly enlarged  Neurological: He is alert. Coordination (the patient is ambulating with the use of a cane) abnormal.  Skin: Skin is warm and dry. No rash noted. He is not diaphoretic. No erythema.    Lab Review:     Component Value Date/Time   NA 138 08/14/2017 1003   K 3.6 08/14/2017 1003   CL 99 11/06/2013 1940   CO2 27 08/14/2017 1003   GLUCOSE 139 08/14/2017 1003   BUN 15.2 08/14/2017 1003   CREATININE 0.9 08/14/2017 1003   CALCIUM 9.8 08/14/2017 1003   PROT 6.8 08/14/2017 1003    ALBUMIN 2.1 (L) 08/14/2017 1003   AST 22 08/14/2017 1003   ALT 14 08/14/2017 1003   ALKPHOS 75 08/14/2017 1003   BILITOT 0.73 08/14/2017 1003   GFRNONAA 75 (L) 11/06/2013 1940   GFRAA 87 (L) 11/06/2013 1940       Component Value Date/Time   WBC 6.1 08/14/2017 1003   WBC 8.7 11/06/2013 1940   RBC 2.85 (L) 08/14/2017 1003   RBC 4.60 11/06/2013 1940   HGB 7.8 (L) 08/14/2017 1003   HCT 24.4 (L) 08/14/2017 1003   PLT 341 08/14/2017 1003   MCV 85.7 08/14/2017 1003   MCH 27.5 08/14/2017 1003   MCH 30.4 11/06/2013 1940   MCHC 32.1 08/14/2017 1003   MCHC 33.8 11/06/2013 1940   RDW 19.1 (H) 08/14/2017 1003   LYMPHSABS 1.0 08/14/2017 1003   MONOABS 0.7 08/14/2017 1003   EOSABS 0.0 08/14/2017 1003   BASOSABS 0.0 08/14/2017 1003   -------------------------------  Imaging from last 24 hours (if applicable):  Radiology interpretation: No results found.      This case was discussed with Dr. Julien Nordmann. He expressed agreement with my management of this patient.

## 2017-08-15 ENCOUNTER — Telehealth: Payer: Self-pay

## 2017-08-15 LAB — URINE CULTURE

## 2017-08-15 NOTE — Telephone Encounter (Signed)
Zigmund Daniel RN with [facility] called with BP 80/56 today. She did dinamap and manual. His sats were 97%, hr 96. Pt was a little dizzy at times. No other c/o. He has appt at [facility] tomorrow for blood. [id number] # [id number].  S/w Sandi Mealy and called pt. Instructed him to drink a lot of fluids today. Also to get up slowly from laying or sitting.  If he starts to feel worse go to ED.

## 2017-08-16 ENCOUNTER — Other Ambulatory Visit: Payer: Self-pay | Admitting: *Deleted

## 2017-08-16 ENCOUNTER — Ambulatory Visit (HOSPITAL_COMMUNITY)
Admission: RE | Admit: 2017-08-16 | Discharge: 2017-08-16 | Disposition: A | Discharge status: Home / Self Care | Payer: Medicare HMO | Source: Ambulatory Visit | Attending: Internal Medicine | Admitting: Internal Medicine

## 2017-08-16 DIAGNOSIS — D649 Anemia, unspecified: Secondary | ICD-10-CM | POA: Diagnosis not present

## 2017-08-16 MED ORDER — HEPARIN SOD (PORK) LOCK FLUSH 100 UNIT/ML IV SOLN: 250.0000 [IU] | INTRAVENOUS | Status: DC | PRN

## 2017-08-16 MED ORDER — SODIUM CHLORIDE 0.9 % IV SOLN
250.0000 mL | Freq: Once | INTRAVENOUS | Status: AC
  Administered 2017-08-16: 250 mL via INTRAVENOUS

## 2017-08-16 MED ORDER — HEPARIN SOD (PORK) LOCK FLUSH 100 UNIT/ML IV SOLN: 500.0000 [IU] | Freq: Every day | INTRAVENOUS | Status: DC | PRN

## 2017-08-16 MED ORDER — SODIUM CHLORIDE 0.9% FLUSH: 10.0000 mL | INTRAVENOUS | Status: DC | PRN

## 2017-08-16 NOTE — Progress Notes (Addendum)
Provider: Mayme Genta   Procedure: 2 units PRBC's    Diagnosis Association: Symptomatic Anemia (D64.9)   Treatment: Patient received 2 units PRBC's. Patient tolerated procedure well with no transfusion reaction. Discharge instructions given to patient and patient states an understanding. Patient alert, oriented, and ambulatory to wheelchair at time of discharge.

## 2017-08-16 NOTE — Discharge Instructions (Signed)

## 2017-08-17 LAB — BPAM RBC
BLOOD PRODUCT EXPIRATION DATE: 201811092359
Blood Product Expiration Date: 201811092359
ISSUE DATE / TIME: 201810190844
ISSUE DATE / TIME: 201810190844
UNIT TYPE AND RH: 5100
Unit Type and Rh: 5100

## 2017-08-17 LAB — TYPE AND SCREEN
ABO/RH(D): O POS
Antibody Screen: NEGATIVE
Unit division: 0
Unit division: 0

## 2017-08-19 NOTE — Progress Notes (Signed)
Noted, no intervention is indicated.

## 2017-08-27 ENCOUNTER — Ambulatory Visit (HOSPITAL_COMMUNITY): Admission: RE | Admit: 2017-08-27 | Payer: Medicare HMO | Source: Ambulatory Visit

## 2017-08-28 ENCOUNTER — Ambulatory Visit: Payer: Medicare HMO | Admitting: Internal Medicine

## 2017-08-28 ENCOUNTER — Telehealth: Payer: Self-pay | Admitting: Medical Oncology

## 2017-08-28 ENCOUNTER — Other Ambulatory Visit: Payer: Medicare HMO

## 2017-08-28 ENCOUNTER — Ambulatory Visit: Payer: Medicare HMO

## 2017-08-28 NOTE — Telephone Encounter (Signed)
Pt did not show up for appts today. I called Rise Paganini and she said pt went yesterday to get barium and when he showed up they said his scan will need to be R/S. Message sent to [facility] scheduling

## 2017-08-29 ENCOUNTER — Telehealth: Payer: Self-pay | Admitting: Internal Medicine

## 2017-08-29 NOTE — Telephone Encounter (Signed)
Scheduled appt for f/u per Rn diane - patient is aware of appt time and date

## 2017-09-04 ENCOUNTER — Ambulatory Visit (HOSPITAL_COMMUNITY)
Admission: RE | Admit: 2017-09-04 | Discharge: 2017-09-04 | Disposition: A | Discharge status: Home / Self Care | Payer: Medicare HMO | Source: Ambulatory Visit | Attending: Internal Medicine | Admitting: Internal Medicine

## 2017-09-04 ENCOUNTER — Encounter (HOSPITAL_COMMUNITY): Payer: Self-pay

## 2017-09-04 DIAGNOSIS — C7972 Secondary malignant neoplasm of left adrenal gland: Secondary | ICD-10-CM | POA: Diagnosis not present

## 2017-09-04 DIAGNOSIS — C3490 Malignant neoplasm of unspecified part of unspecified bronchus or lung: Secondary | ICD-10-CM

## 2017-09-04 DIAGNOSIS — J90 Pleural effusion, not elsewhere classified: Secondary | ICD-10-CM | POA: Diagnosis not present

## 2017-09-04 DIAGNOSIS — M899 Disorder of bone, unspecified: Secondary | ICD-10-CM | POA: Insufficient documentation

## 2017-09-04 DIAGNOSIS — C7971 Secondary malignant neoplasm of right adrenal gland: Secondary | ICD-10-CM | POA: Diagnosis not present

## 2017-09-04 MED ORDER — IOPAMIDOL (ISOVUE-300) INJECTION 61%
INTRAVENOUS | Status: AC
  Filled 2017-09-04: qty 100

## 2017-09-04 MED ORDER — IOPAMIDOL (ISOVUE-300) INJECTION 61%
100.0000 mL | Freq: Once | INTRAVENOUS | Status: AC | PRN
  Administered 2017-09-04: 100 mL via INTRAVENOUS

## 2017-09-09 ENCOUNTER — Encounter: Payer: Self-pay | Admitting: Oncology

## 2017-09-09 ENCOUNTER — Other Ambulatory Visit: Payer: Self-pay | Admitting: Internal Medicine

## 2017-09-09 ENCOUNTER — Ambulatory Visit (HOSPITAL_BASED_OUTPATIENT_CLINIC_OR_DEPARTMENT_OTHER): Payer: Medicare HMO | Admitting: Oncology

## 2017-09-09 VITALS — BP 96/56 | HR 96 | Temp 98.4°F | Wt 167.0 lb

## 2017-09-09 DIAGNOSIS — C3411 Malignant neoplasm of upper lobe, right bronchus or lung: Secondary | ICD-10-CM | POA: Diagnosis not present

## 2017-09-09 DIAGNOSIS — C7951 Secondary malignant neoplasm of bone: Secondary | ICD-10-CM | POA: Diagnosis not present

## 2017-09-09 DIAGNOSIS — R634 Abnormal weight loss: Secondary | ICD-10-CM

## 2017-09-09 DIAGNOSIS — C7972 Secondary malignant neoplasm of left adrenal gland: Secondary | ICD-10-CM | POA: Diagnosis not present

## 2017-09-09 DIAGNOSIS — M25461 Effusion, right knee: Secondary | ICD-10-CM

## 2017-09-09 DIAGNOSIS — C3491 Malignant neoplasm of unspecified part of right bronchus or lung: Secondary | ICD-10-CM

## 2017-09-09 DIAGNOSIS — R5383 Other fatigue: Secondary | ICD-10-CM | POA: Diagnosis not present

## 2017-09-09 DIAGNOSIS — M1990 Unspecified osteoarthritis, unspecified site: Secondary | ICD-10-CM

## 2017-09-09 DIAGNOSIS — C7971 Secondary malignant neoplasm of right adrenal gland: Secondary | ICD-10-CM | POA: Diagnosis not present

## 2017-09-09 DIAGNOSIS — M25462 Effusion, left knee: Secondary | ICD-10-CM

## 2017-09-09 NOTE — Patient Instructions (Signed)
Nivolumab injection What is this medicine? NIVOLUMAB (nye VOL ue mab) is a monoclonal antibody. It is used to treat melanoma, lung cancer, kidney cancer, head and neck cancer, Hodgkin lymphoma, urothelial cancer, colon cancer, and liver cancer. This medicine may be used for other purposes; ask your health care provider or pharmacist if you have questions. COMMON BRAND NAME(S): Opdivo What should I tell my health care provider before I take this medicine? They need to know if you have any of these conditions: -diabetes -immune system problems -kidney disease -liver disease -lung disease -organ transplant -stomach or intestine problems -thyroid disease -an unusual or allergic reaction to nivolumab, other medicines, foods, dyes, or preservatives -pregnant or trying to get pregnant -breast-feeding How should I use this medicine? This medicine is for infusion into a vein. It is given by a health care professional in a hospital or clinic setting. A special MedGuide will be given to you before each treatment. Be sure to read this information carefully each time. Talk to your pediatrician regarding the use of this medicine in children. While this drug may be prescribed for children as young as 12 years for selected conditions, precautions do apply. Overdosage: If you think you have taken too much of this medicine contact a poison control center or emergency room at once. NOTE: This medicine is only for you. Do not share this medicine with others. What if I miss a dose? It is important not to miss your dose. Call your doctor or health care professional if you are unable to keep an appointment. What may interact with this medicine? Interactions have not been studied. Give your health care provider a list of all the medicines, herbs, non-prescription drugs, or dietary supplements you use. Also tell them if you smoke, drink alcohol, or use illegal drugs. Some items may interact with your  medicine. This list may not describe all possible interactions. Give your health care provider a list of all the medicines, herbs, non-prescription drugs, or dietary supplements you use. Also tell them if you smoke, drink alcohol, or use illegal drugs. Some items may interact with your medicine. What should I watch for while using this medicine? This drug may make you feel generally unwell. Continue your course of treatment even though you feel ill unless your doctor tells you to stop. You may need blood work done while you are taking this medicine. Do not become pregnant while taking this medicine or for 5 months after stopping it. Women should inform their doctor if they wish to become pregnant or think they might be pregnant. There is a potential for serious side effects to an unborn child. Talk to your health care professional or pharmacist for more information. Do not breast-feed an infant while taking this medicine. What side effects may I notice from receiving this medicine? Side effects that you should report to your doctor or health care professional as soon as possible: -allergic reactions like skin rash, itching or hives, swelling of the face, lips, or tongue -black, tarry stools -blood in the urine -bloody or watery diarrhea -changes in vision -change in sex drive -changes in emotions or moods -chest pain -confusion -cough -decreased appetite -diarrhea -facial flushing -feeling faint or lightheaded -fever, chills -hair loss -hallucination, loss of contact with reality -headache -irritable -joint pain -loss of memory -muscle pain -muscle weakness -seizures -shortness of breath -signs and symptoms of high blood sugar such as dizziness; dry mouth; dry skin; fruity breath; nausea; stomach pain; increased hunger or thirst; increased   urination -signs and symptoms of kidney injury like trouble passing urine or change in the amount of urine -signs and symptoms of liver injury  like dark yellow or brown urine; general ill feeling or flu-like symptoms; light-colored stools; loss of appetite; nausea; right upper belly pain; unusually weak or tired; yellowing of the eyes or skin -stiff neck -swelling of the ankles, feet, hands -weight gain Side effects that usually do not require medical attention (report to your doctor or health care professional if they continue or are bothersome): -bone pain -constipation -tiredness -vomiting This list may not describe all possible side effects. Call your doctor for medical advice about side effects. You may report side effects to FDA at 1-800-FDA-1088. Where should I keep my medicine? This drug is given in a hospital or clinic and will not be stored at home. NOTE: This sheet is a summary. It may not cover all possible information. If you have questions about this medicine, talk to your doctor, pharmacist, or health care provider.  2018 Elsevier/Gold Standard (2016-07-23 17:49:34)  

## 2017-09-09 NOTE — Progress Notes (Signed)
[place] OFFICE PROGRESS NOTE  [name], MD [place] [id number]  DIAGNOSIS: Stage IV (T3, N3, M1b) non-small cell lung cancer, adenocarcinoma with negative EGFR, ALK, ROS 1 and BRAF mutations as well as negative PDL 1 expression diagnosed in February 2018 and presented with large right upper lobe lung mass with mediastinal and left cervical lymphadenopathy as well as metastatic disease to the adrenal glands and bones.  PRIOR THERAPY:  1) Systemic chemotherapy with carboplatin for AUC of 5 and Alimta 500 MG/M2 every 3 weeks. Status post 6 cycles with partial response. 2) Maintenance systemic chemotherapy with single agent Alimta 500 MG/M2 every 3 weeks. First dose 06/19/2017. Status post 3 cycles.  CURRENT THERAPY: Nivolumab 480 mg IV given every 4 weeks.  First dose to be started 09/24/2017.  INTERVAL HISTORY: Jim [name] 71 y.o. male returns for routine follow-up visit with his sister.  Since his last visit, the patient has been having increased fatigue and weakness along with weight loss.  He has ongoing pain to his bilateral knees and has received injections.  Patient has not had any fevers or chills.  He denies chest pain, shortness of breath at rest, cough, hemoptysis.  He does report shortness of breath with exertion.  Denies nausea, vomiting, constipation, diarrhea.  Patient had a blood transfusion several weeks ago with no improvement in his fatigue or weakness.  The patient had a recent restaging CT scan of the chest, abdomen, pelvis and is here to discuss the results.  MEDICAL HISTORY: Past Medical History:  Diagnosis Date  . Adenocarcinoma of right lung, stage 4 ([facility]) 01/03/2017  . Alcohol abuse 02/06/2011   with alcohol withdrawal during hospital stay  . Angioedema 02/06/2011   lisinopril  . Cardiomyopathy 02/06/2011   Cardiomyopathy with systolic heart failure, newly diagnosed  . Confusion 02/06/2011   possibly  secondary to cerebrovascular accident  versus dementia  . CVA (cerebral infarction) 02/06/2011   Ischemic acute infarct in right anterior cerebral artery territory  . Diabetes mellitus   . Encounter for antineoplastic chemotherapy 01/03/2017  . Goals of care, counseling/discussion 01/03/2017  . Hypertension    Mild LVH per echo  . Kidney disease, chronic, stage III (GFR 30-59 ml/min) (HCC)   . Left bundle branch block 02/06/2011  . Marijuana abuse 02/06/2011  . Nonischemic cardiomyopathy (HCC)    EF is 30 to 35%  . Periodontal disease   . Periodontal disease    extensive  . Tobacco abuse     ALLERGIES:  is allergic to lisinopril and metformin and related.  MEDICATIONS:  Current Outpatient Medications  Medication Sig Dispense Refill  . albuterol (PROVENTIL HFA;VENTOLIN HFA) 108 (90 Base) MCG/ACT inhaler Inhale 1 puff into the lungs every 6 (six) hours as needed.    Marland Kitchen aspirin 81 MG tablet Take 81 mg by mouth daily.      Marland Kitchen buPROPion (WELLBUTRIN SR) 100 MG 12 hr tablet Take 100 mg by mouth 2 (two) times daily.    . carvedilol (COREG) 12.5 MG tablet Take 1 tablet by mouth daily.    . cetirizine (ZYRTEC) 10 MG tablet Take 10 mg by mouth daily.    . clopidogrel (PLAVIX) 75 MG tablet Take 75 mg by mouth daily with breakfast.    . dexamethasone (DECADRON) 4 MG tablet 4 mg by mouth twice a day the day before, day of and day after the chemotherapy every 3 weeks 40 tablet 1  . fluticasone (  FLONASE) 50 MCG/ACT nasal spray Place 1 spray into both nostrils daily.  0  . folic acid (FOLVITE) 1 MG tablet take 1 tablet by mouth once daily 30 tablet 4  . hydrALAZINE (APRESOLINE) 25 MG tablet Take 1 tablet (25 mg total) by mouth 2 (two) times daily. 120 tablet 11  . hydrochlorothiazide (HYDRODIURIL) 25 MG tablet Take 25 mg by mouth daily.    . Insulin Glargine (LANTUS) 100 UNIT/ML Solostar Pen Inject 14 Units into the skin 2 (two) times daily. Inject 28 units daily    . isosorbide mononitrate (IMDUR) 30 MG  24 hr tablet TAKE 1 TABLET BY MOUTH DAILY    . multivitamin (THERAGRAN) per tablet Take 1 tablet by mouth daily.      . rosuvastatin (CRESTOR) 10 MG tablet Take 10 mg by mouth daily.      . tamsulosin (FLOMAX) 0.4 MG CAPS capsule     . traMADol (ULTRAM) 50 MG tablet Take 50 mg by mouth every 8 (eight) hours as needed for moderate pain.    . diphenhydrAMINE (BENADRYL) 25 MG tablet Take 1 tablet (25 mg total) by mouth every 6 (six) hours. (Patient not taking: Reported on 07/18/2017) 20 tablet 0  . famotidine (PEPCID) 20 MG tablet Take 1 tablet (20 mg total) by mouth 2 (two) times daily. (Patient not taking: Reported on 09/09/2017) 10 tablet 0  . HYDROcodone-acetaminophen (NORCO/VICODIN) 5-325 MG tablet Take 1 tablet by mouth every 8 (eight) hours as needed.  0  . nitroGLYCERIN (NITROSTAT) 0.4 MG SL tablet Place 0.4 mg under the tongue every 5 (five) minutes as needed for chest pain.     Marland Kitchen prochlorperazine (COMPAZINE) 10 MG tablet Take 1 tablet (10 mg total) by mouth every 6 (six) hours as needed for nausea or vomiting. (Patient not taking: Reported on 07/18/2017) 30 tablet 0   No current facility-administered medications for this visit.     SURGICAL HISTORY:  Past Surgical History:  Procedure Laterality Date  . US ECHOCARDIOGRAPHY  April 2012   EF 30 to 35 %    REVIEW OF SYSTEMS:   Review of Systems  Constitutional: Negative for chills and fever. Positive for fatigue, weight loss, decreased appetite. HENT:   Negative for mouth sores, nosebleeds, sore throat and trouble swallowing.   Eyes: Negative for eye problems and icterus.  Respiratory: Negative for cough, hemoptysis, shortness of breath at rest and wheezing.  Positive for shortness of breath with exertion.   Cardiovascular: Negative for chest pain and leg swelling.  Gastrointestinal: Negative for abdominal pain, constipation, diarrhea, nausea and vomiting.  Genitourinary: Negative for bladder incontinence, difficulty urinating, dysuria,  frequency and hematuria.   Musculoskeletal: Negative for back pain, gait problem, neck pain and neck stiffness.  Skin: Negative for itching and rash.  Neurological: Negative for dizziness, extremity weakness, gait problem, headaches, light-headedness and seizures.  Hematological: Negative for adenopathy. Does not bruise/bleed easily.  Psychiatric/Behavioral: Negative for confusion, depression and sleep disturbance. The patient is not nervous/anxious.     PHYSICAL EXAMINATION:  Blood pressure (!) 96/56, pulse 96, temperature 98.4 F (36.9 C), temperature source Oral, weight 167 lb (75.8 kg), SpO2 100 %.  ECOG PERFORMANCE STATUS: 1 - Symptomatic but completely ambulatory  Physical Exam  Constitutional: Alert, cooperative, no distress.  HENT:  Head: Normocephalic and atraumatic.  Mouth/Throat: Oropharynx is clear and moist. No oropharyngeal exudate.   Eyes: Conjunctivae are normal. Right eye exhibits no discharge. Left eye exhibits no discharge. No scleral icterus.  Neck: Normal range  of motion. Neck supple.  Cardiovascular: Normal rate, regular rhythm, normal heart sounds and intact distal pulses.   Pulmonary/Chest: Effort normal and breath sounds normal. No respiratory distress. No wheezes. No rales.  Abdominal: Soft. Bowel sounds are normal. Exhibits no distension and no mass. There is no tenderness.  Musculoskeletal: Normal range of motion. Exhibits no edema.  Lymphadenopathy:    No cervical adenopathy.  Neurological: Alert and oriented to person, place, and time. Exhibits normal muscle tone. Coordination normal.  Skin: Skin is warm and dry. No rash noted. Not diaphoretic. No erythema. No pallor.  Psychiatric: Mood, memory and judgment normal.  Vitals reviewed.  LABORATORY DATA: Lab Results  Component Value Date   WBC 6.1 08/14/2017   HGB 7.8 (L) 08/14/2017   HCT 24.4 (L) 08/14/2017   MCV 85.7 08/14/2017   PLT 341 08/14/2017      Chemistry      Component Value Date/Time    NA 138 08/14/2017 1003   K 3.6 08/14/2017 1003   CL 99 11/06/2013 1940   CO2 27 08/14/2017 1003   BUN 15.2 08/14/2017 1003   CREATININE 0.9 08/14/2017 1003      Component Value Date/Time   CALCIUM 9.8 08/14/2017 1003   ALKPHOS 75 08/14/2017 1003   AST 22 08/14/2017 1003   ALT 14 08/14/2017 1003   BILITOT 0.73 08/14/2017 1003       RADIOGRAPHIC STUDIES:  Ct Chest W Contrast  Result Date: 09/04/2017 CLINICAL DATA:  Followup metastatic right lung adenocarcinoma. Undergoing chemotherapy. Cough for 3 weeks. Shortness breath. Constipation and diarrhea. EXAM: CT CHEST, ABDOMEN, AND PELVIS WITH CONTRAST TECHNIQUE: Multidetector CT imaging of the chest, abdomen and pelvis was performed following the standard protocol during bolus administration of intravenous contrast. CONTRAST:  133m ISOVUE-300 IOPAMIDOL (ISOVUE-300) INJECTION 61% COMPARISON:  06/06/2017 and 03/27/2017 FINDINGS: CT CHEST FINDINGS Cardiovascular: No acute findings. Aortic and coronary artery atherosclerosis. Mediastinum/Lymph Nodes: Stable small mediastinal lymph nodes in right paratracheal region and AP window with largest lymph node measuring 11 mm. Lungs/Pleura: Heterogeneous enhancing mass in the central right upper lobe shows involvement of the right hilum and mediastinum. This has increased since previous study, currently measuring 8.5 x 6.6 cm on image 25/2, compared to 4.9 x 4.4 cm previously when measured in the same planes. Small right pleural effusion has decreased in size. 6 x 12 mm pulmonary nodule in the anterior right lung apex has not significantly changed in size since previous study. Sub-cm pulmonary nodules in left upper and lower lobes are also stable. Musculoskeletal:  No suspicious bone lesions identified. CT ABDOMEN AND PELVIS FINDINGS Hepatobiliary: No masses identified. Gallbladder is unremarkable. Pancreas:  No mass or inflammatory changes. Spleen:  Within normal limits in size and appearance.  Adrenals/Urinary tract: Stable bilateral adrenal masses, both measuring approximately 17 mm in diameter . Tiny nonobstructive upper pole renal calculi are seen bilaterally. Several small bilateral renal cysts are also noted. No evidence of renal mass or hydronephrosis. Stomach/Bowel: No evidence of obstruction, inflammatory process, or abnormal fluid collections. Large colonic stool burden noted. Vascular/Lymphatic: No pathologically enlarged lymph nodes identified. No abdominal aortic aneurysm. Aortic atherosclerosis. Reproductive: Stable prostate median lobe hypertrophy noted, with mild mass effect on bladder base. Other:  None. Musculoskeletal: Mixed lytic and sclerotic lesion involving the L3 vertebral body shows mild increase in size compared to previous studies, suspicious for bone metastasis. Old fracture of the left L3 transverse process shows no significant change. IMPRESSION: Increased size of 8.5 cm central right upper lobe  mass with involvement of right hilum and mediastinum. Mild increase in mixed lytic and sclerotic lesion in L3 vertebral body. Although no other suspicious bone lesions identified, bone metastasis cannot be excluded. No significant change in 6 x 12 mm pulmonary nodule in right lung apex, tiny sub-cm left lung nodules, and shotty mediastinal lymph nodes. Recommend continued attention on follow-up CT. Stable bilateral adrenal metastasis. Decreased size of small right pleural effusion. Electronically Signed   By: Earle Gell M.D.   On: 09/04/2017 13:36   Ct Abdomen Pelvis W Contrast  Result Date: 09/04/2017 CLINICAL DATA:  Followup metastatic right lung adenocarcinoma. Undergoing chemotherapy. Cough for 3 weeks. Shortness breath. Constipation and diarrhea. EXAM: CT CHEST, ABDOMEN, AND PELVIS WITH CONTRAST TECHNIQUE: Multidetector CT imaging of the chest, abdomen and pelvis was performed following the standard protocol during bolus administration of intravenous contrast. CONTRAST:   154m ISOVUE-300 IOPAMIDOL (ISOVUE-300) INJECTION 61% COMPARISON:  06/06/2017 and 03/27/2017 FINDINGS: CT CHEST FINDINGS Cardiovascular: No acute findings. Aortic and coronary artery atherosclerosis. Mediastinum/Lymph Nodes: Stable small mediastinal lymph nodes in right paratracheal region and AP window with largest lymph node measuring 11 mm. Lungs/Pleura: Heterogeneous enhancing mass in the central right upper lobe shows involvement of the right hilum and mediastinum. This has increased since previous study, currently measuring 8.5 x 6.6 cm on image 25/2, compared to 4.9 x 4.4 cm previously when measured in the same planes. Small right pleural effusion has decreased in size. 6 x 12 mm pulmonary nodule in the anterior right lung apex has not significantly changed in size since previous study. Sub-cm pulmonary nodules in left upper and lower lobes are also stable. Musculoskeletal:  No suspicious bone lesions identified. CT ABDOMEN AND PELVIS FINDINGS Hepatobiliary: No masses identified. Gallbladder is unremarkable. Pancreas:  No mass or inflammatory changes. Spleen:  Within normal limits in size and appearance. Adrenals/Urinary tract: Stable bilateral adrenal masses, both measuring approximately 17 mm in diameter . Tiny nonobstructive upper pole renal calculi are seen bilaterally. Several small bilateral renal cysts are also noted. No evidence of renal mass or hydronephrosis. Stomach/Bowel: No evidence of obstruction, inflammatory process, or abnormal fluid collections. Large colonic stool burden noted. Vascular/Lymphatic: No pathologically enlarged lymph nodes identified. No abdominal aortic aneurysm. Aortic atherosclerosis. Reproductive: Stable prostate median lobe hypertrophy noted, with mild mass effect on bladder base. Other:  None. Musculoskeletal: Mixed lytic and sclerotic lesion involving the L3 vertebral body shows mild increase in size compared to previous studies, suspicious for bone metastasis. Old  fracture of the left L3 transverse process shows no significant change. IMPRESSION: Increased size of 8.5 cm central right upper lobe mass with involvement of right hilum and mediastinum. Mild increase in mixed lytic and sclerotic lesion in L3 vertebral body. Although no other suspicious bone lesions identified, bone metastasis cannot be excluded. No significant change in 6 x 12 mm pulmonary nodule in right lung apex, tiny sub-cm left lung nodules, and shotty mediastinal lymph nodes. Recommend continued attention on follow-up CT. Stable bilateral adrenal metastasis. Decreased size of small right pleural effusion. Electronically Signed   By: JEarle GellM.D.   On: 09/04/2017 13:36     ASSESSMENT/PLAN:  Adenocarcinoma of right lung, stage 4 (HCC) This is a very pleasant 71year old African-American male with stage IV non-small cell lung cancer, adenocarcinoma with no actionable mutations and currently undergoing systemic chemotherapy with carboplatin and Alimta status post 6 cycles. The patient is currently on maintenance treatment with single agent Alimta status post 3 cycles and has  been tolerating this treatment fairly well except for fatigue.  The patient was seen with Dr. Earlie Server.  CT scan results were discussed with the patient and his sister.  Explained to them that the patient has evidence of progressive disease on his scans.  Recommend he discontinue maintenance Alimta.  We had a lengthy discussion regarding treatment options including immunotherapy with Nivolumab 480 mg IV every 4 weeks versus systemic chemotherapy with a different agent versus palliative care/hospice.  Risks and benefits of each were discussed with the patient and his sister.  Chemotherapy would likely be difficult for the patient to tolerate.  The patient would like to proceed with treatment with immunotherapy. Adverse effects of this treatment were discussed including but not limited to immune mediated the skin rash, diarrhea,  inflammation of the lung, kidney, liver, thyroid or other endocrine dysfunction.  We will plan to start the patient on his immunotherapy following the Thanksgiving holiday around 09/24/2017.  We will plan to see the patient back for evaluation prior to cycle 2 of his Nivolumab.  The patient voices understanding of current disease status and treatment options and is in agreement with the current care plan. All questions were answered. The patient knows to call the clinic with any problems, questions or concerns. We can certainly see the patient much sooner if necessary.  Orders Placed This Encounter  Procedures  . Comprehensive metabolic panel    Standing Status:   Standing    Number of Occurrences:   10    Standing Expiration Date:   09/09/2018  . CBC with Differential/Platelet    Standing Status:   Standing    Number of Occurrences:   10    Standing Expiration Date:   09/09/2018  . TSH    Standing Status:   Standing    Number of Occurrences:   8    Standing Expiration Date:   09/09/2018    Mikey Bussing, DNP, AGPCNP-BC, AOCNP 09/09/17  ADDENDUM: Hematology/Oncology Attending: I had a face-to-face encounter with the patient.  I recommended his care plan.  This is a very pleasant 71 years old African-American male with metastatic non-small cell lung cancer, adenocarcinoma status post induction systemic chemotherapy with carboplatin and Alimta status post 6 cycles followed by maintenance Alimta status post 3 cycles. The patient has been tolerating his systemic chemotherapy well except for fatigue.  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 The patient had a repeat CT scan of the chest, abdomen and pelvis performed recently.  I personally and independently reviewed the scans and discussed the results with the patient and his sister today. Unfortunately has a scan showed evidence for disease progression.  I  discussed with the patient several options for management of his condition including palliative care and hospice referral versus consideration of second line treatment with immunotherapy was Nivolumab 480 mg IV every 4 weeks versus consideration of systemic chemotherapy with docetaxel and Cyramza. After discussion of all the options, the patient would like to consider treatment with Nivolumab. He is expected to start the first dose of this treatment on September 24, 2017. I discussed with the patient the adverse effect of the immunotherapy including but not limited to immunotherapy mediated skin rash, diarrhea, inflammation of the lung, kidney, liver, thyroid or other endocrine dysfunction including type 1 diabetes mellitus. The patient would like to proceed with the immunotherapy as a schedule. We will see him back for follow-up visit  in 6 weeks with the start of cycle #2. The patient was advised to call immediately if he has any concerning symptoms in the interval.  Disclaimer: This note was dictated with voice recognition software. Similar sounding words can inadvertently be transcribed and may be missed upon review. Eilleen Kempf, MD 09/09/17

## 2017-09-09 NOTE — Assessment & Plan Note (Signed)
This is a very pleasant 71 year old African-American male with stage IV non-small cell lung cancer, adenocarcinoma with no actionable mutations and currently undergoing systemic chemotherapy with carboplatin and Alimta status post 6 cycles. The patient is currently on maintenance treatment with single agent Alimta status post 3 cycles and has been tolerating this treatment fairly well except for fatigue.  The patient was seen with Dr. Earlie Server.  CT scan results were discussed with the patient and his sister.  Explained to them that the patient has evidence of progressive disease on his scans.  Recommend he discontinue maintenance Alimta.  We had a lengthy discussion regarding treatment options including immunotherapy with Nivolumab 480 mg IV every 4 weeks versus systemic chemotherapy with a different agent versus palliative care/hospice.  Risks and benefits of each were discussed with the patient and his sister.  Chemotherapy would likely be difficult for the patient to tolerate.  The patient would like to proceed with treatment with immunotherapy. Adverse effects of this treatment were discussed including but not limited to immune mediated the skin rash, diarrhea, inflammation of the lung, kidney, liver, thyroid or other endocrine dysfunction.  We will plan to start the patient on his immunotherapy following the Thanksgiving holiday around 09/24/2017.  We will plan to see the patient back for evaluation prior to cycle 2 of his Nivolumab.  The patient voices understanding of current disease status and treatment options and is in agreement with the current care plan. All questions were answered. The patient knows to call the clinic with any problems, questions or concerns. We can certainly see the patient much sooner if necessary.

## 2017-09-09 NOTE — Progress Notes (Signed)
DISCONTINUE ON PATHWAY REGIMEN - Non-Small Cell Lung     A cycle is every 21 days:     Pemetrexed   **Always confirm dose/schedule in your pharmacy ordering system**    REASON: Disease Progression PRIOR TREATMENT: [id number]: Pemetrexed 500 mg/m2 q21 Days Until Progression or Unacceptable Toxicity TREATMENT RESPONSE: Progressive Disease (PD)  START ON PATHWAY REGIMEN - Non-Small Cell Lung     A cycle is every 28 days:     Nivolumab   **Always confirm dose/schedule in your pharmacy ordering system**    Patient Characteristics: Stage IV Metastatic, Nonsquamous, Second Line - Chemotherapy/Immunotherapy, PS = 0, 1, No Prior PD-1/PD-L1  Inhibitor and Immunotherapy Candidate AJCC T Category: T3 Current Disease Status: Distant Metastases AJCC N Category: N3 AJCC M Category: M1c AJCC 8 Stage Grouping: IVB Histology: Nonsquamous Cell ROS1 Rearrangement Status: Quantity Not Sufficient T790M Mutation Status: Not Applicable - EGFR Mutation Negative/Unknown Other Mutations/Biomarkers: No Other Actionable Mutations PD-L1 Expression Status: PD-L1 Negative Chemotherapy/Immunotherapy LOT: Second Line Chemotherapy/Immunotherapy Molecular Targeted Therapy: Not Appropriate ALK Translocation Status: Negative Would you be surprised if this patient died  in the next year<= I would NOT be surprised if this patient died in the next year EGFR Mutation Status: Negative/Wild Type BRAF V600E Mutation Status: Quantity Not Sufficient Performance Status: PS = 0, 1 Immunotherapy Candidate Status: Candidate for Immunotherapy Prior Immunotherapy Status: No Prior PD-1/PD-L1 Inhibitor Intent of Therapy: Non-Curative / Palliative Intent, Discussed with Patient

## 2017-09-14 ENCOUNTER — Telehealth: Payer: Self-pay | Admitting: Internal Medicine

## 2017-09-14 NOTE — Telephone Encounter (Signed)
Unable to schedule 11/12 apts due to capped days - sent message to [place] will contact patient when we get the okay to add patinet for 11/27 - capped day .

## 2017-09-16 ENCOUNTER — Telehealth: Payer: Self-pay | Admitting: Internal Medicine

## 2017-09-16 NOTE — Telephone Encounter (Signed)
Scheduled appt per 11/12 los - Patient is aware of appt date and time.

## 2017-09-18 ENCOUNTER — Ambulatory Visit: Payer: Medicare HMO | Admitting: Oncology

## 2017-09-18 ENCOUNTER — Other Ambulatory Visit: Payer: Medicare HMO

## 2017-09-18 ENCOUNTER — Ambulatory Visit: Payer: Medicare HMO

## 2017-09-24 ENCOUNTER — Ambulatory Visit (HOSPITAL_BASED_OUTPATIENT_CLINIC_OR_DEPARTMENT_OTHER): Payer: Medicare HMO

## 2017-09-24 ENCOUNTER — Other Ambulatory Visit (HOSPITAL_BASED_OUTPATIENT_CLINIC_OR_DEPARTMENT_OTHER): Payer: Medicare HMO

## 2017-09-24 ENCOUNTER — Telehealth: Payer: Self-pay

## 2017-09-24 VITALS — BP 121/78 | HR 103 | Temp 97.1°F | Resp 20 | Wt 147.0 lb

## 2017-09-24 DIAGNOSIS — C7972 Secondary malignant neoplasm of left adrenal gland: Secondary | ICD-10-CM | POA: Diagnosis not present

## 2017-09-24 DIAGNOSIS — C7951 Secondary malignant neoplasm of bone: Secondary | ICD-10-CM

## 2017-09-24 DIAGNOSIS — C3411 Malignant neoplasm of upper lobe, right bronchus or lung: Secondary | ICD-10-CM

## 2017-09-24 DIAGNOSIS — Z79899 Other long term (current) drug therapy: Secondary | ICD-10-CM | POA: Diagnosis not present

## 2017-09-24 DIAGNOSIS — C3491 Malignant neoplasm of unspecified part of right bronchus or lung: Secondary | ICD-10-CM

## 2017-09-24 DIAGNOSIS — Z5112 Encounter for antineoplastic immunotherapy: Secondary | ICD-10-CM

## 2017-09-24 DIAGNOSIS — C7971 Secondary malignant neoplasm of right adrenal gland: Secondary | ICD-10-CM | POA: Diagnosis not present

## 2017-09-24 LAB — CBC WITH DIFFERENTIAL/PLATELET
BASO%: 0.6 % (ref 0.0–2.0)
Basophils Absolute: 0.1 10*3/uL (ref 0.0–0.1)
EOS ABS: 0.1 10*3/uL (ref 0.0–0.5)
EOS%: 0.8 % (ref 0.0–7.0)
HCT: 27 % — ABNORMAL LOW (ref 38.4–49.9)
HEMOGLOBIN: 8.3 g/dL — AB (ref 13.0–17.1)
LYMPH#: 0.9 10*3/uL (ref 0.9–3.3)
LYMPH%: 8.3 % — AB (ref 14.0–49.0)
MCH: 26.9 pg — ABNORMAL LOW (ref 27.2–33.4)
MCHC: 30.8 g/dL — ABNORMAL LOW (ref 32.0–36.0)
MCV: 87.4 fL (ref 79.3–98.0)
MONO#: 0.9 10*3/uL (ref 0.1–0.9)
MONO%: 8 % (ref 0.0–14.0)
NEUT%: 82.3 % — ABNORMAL HIGH (ref 39.0–75.0)
NEUTROS ABS: 9.1 10*3/uL — AB (ref 1.5–6.5)
PLATELETS: 446 10*3/uL — AB (ref 140–400)
RBC: 3.1 10*6/uL — AB (ref 4.20–5.82)
RDW: 17.4 % — AB (ref 11.0–14.6)
WBC: 11 10*3/uL — ABNORMAL HIGH (ref 4.0–10.3)

## 2017-09-24 LAB — COMPREHENSIVE METABOLIC PANEL
ALBUMIN: 1.9 g/dL — AB (ref 3.5–5.0)
ALK PHOS: 80 U/L (ref 40–150)
ALT: 12 U/L (ref 0–55)
ANION GAP: 9 meq/L (ref 3–11)
AST: 22 U/L (ref 5–34)
BILIRUBIN TOTAL: 0.5 mg/dL (ref 0.20–1.20)
BUN: 9.3 mg/dL (ref 7.0–26.0)
CO2: 24 meq/L (ref 22–29)
CREATININE: 0.8 mg/dL (ref 0.7–1.3)
Calcium: 9.6 mg/dL (ref 8.4–10.4)
Chloride: 107 mEq/L (ref 98–109)
GLUCOSE: 89 mg/dL (ref 70–140)
Potassium: 3.6 mEq/L (ref 3.5–5.1)
Sodium: 140 mEq/L (ref 136–145)
TOTAL PROTEIN: 7.1 g/dL (ref 6.4–8.3)

## 2017-09-24 LAB — TSH: TSH: 1.181 m[IU]/L (ref 0.320–4.118)

## 2017-09-24 MED ORDER — SODIUM CHLORIDE 0.9 % IV SOLN
Freq: Once | INTRAVENOUS | Status: AC
  Administered 2017-09-24: 10:00:00 via INTRAVENOUS

## 2017-09-24 MED ORDER — SODIUM CHLORIDE 0.9 % IV SOLN
480.0000 mg | Freq: Once | INTRAVENOUS | Status: AC
  Administered 2017-09-24: 480 mg via INTRAVENOUS
  Filled 2017-09-24: qty 48

## 2017-09-24 NOTE — Telephone Encounter (Signed)
Pt called to clarify which meds to stop. He was confused by the multiple names of the meds. Clarified decadron and vit b-12.   He mentioned he is having urinary frequency. He is on tamsulosin and feels that is doing too much. Also he feels  the chemotherapy is contributing to urinary frequency.  A few days after chemo he will get frequency and pain with urination, stop and start. This will last about 5 days. He is urinating every couple hours. Encouraged him to call Dr Doreene Nest about this. He had an appt with urologist but urologist was in surgery and they did not reschedule him. The referral was placed 6 months ago.

## 2017-09-24 NOTE — Patient Instructions (Addendum)
Per your office visit with Dr. Doreene Nest on 09/11/17, discontinue taking hydrochlorithiazide.  As of today, please also stop taking Decadron and Vitamin B12 tablets.   [facility] Discharge Instructions for Patients Receiving Chemotherapy  Today you received the following chemotherapy agents: Nivolumab (Opdivo).  To help prevent nausea and vomiting after your treatment, we encourage you to take your nausea medication as directed   If you develop nausea and vomiting that is not controlled by your nausea medication, call the clinic.   BELOW ARE SYMPTOMS THAT SHOULD BE REPORTED IMMEDIATELY:  *FEVER GREATER THAN 100.5 F  *CHILLS WITH OR WITHOUT FEVER  NAUSEA AND VOMITING THAT IS NOT CONTROLLED WITH YOUR NAUSEA MEDICATION  *UNUSUAL SHORTNESS OF BREATH  *UNUSUAL BRUISING OR BLEEDING  TENDERNESS IN MOUTH AND THROAT WITH OR WITHOUT PRESENCE OF ULCERS  *URINARY PROBLEMS  *BOWEL PROBLEMS  UNUSUAL RASH Items with * indicate a potential emergency and should be followed up as soon as possible.  Feel free to call the clinic should you have any questions or concerns. The clinic phone number is [phone].  Please show the [place] at check-in to the Emergency Department and triage nurse.  Nivolumab injection What is this medicine? NIVOLUMAB (nye VOL ue mab) is a monoclonal antibody. It is used to treat melanoma, lung cancer, kidney cancer, head and neck cancer, Hodgkin lymphoma, urothelial cancer, colon cancer, and liver cancer. This medicine may be used for other purposes; ask your health care provider or pharmacist if you have questions. COMMON BRAND NAME(S): Opdivo What should I tell my health care provider before I take this medicine? They need to know if you have any of these conditions: -diabetes -immune system problems -kidney disease -liver disease -lung disease -organ transplant -stomach or intestine problems -thyroid disease -an unusual or  allergic reaction to nivolumab, other medicines, foods, dyes, or preservatives -pregnant or trying to get pregnant -breast-feeding How should I use this medicine? This medicine is for infusion into a vein. It is given by a health care professional in a hospital or clinic setting. A special MedGuide will be given to you before each treatment. Be sure to read this information carefully each time. Talk to your pediatrician regarding the use of this medicine in children. While this drug may be prescribed for children as young as 12 years for selected conditions, precautions do apply. Overdosage: If you think you have taken too much of this medicine contact a poison control center or emergency room at once. NOTE: This medicine is only for you. Do not share this medicine with others. What if I miss a dose? It is important not to miss your dose. Call your doctor or health care professional if you are unable to keep an appointment. What may interact with this medicine? Interactions have not been studied. Give your health care provider a list of all the medicines, herbs, non-prescription drugs, or dietary supplements you use. Also tell them if you smoke, drink alcohol, or use illegal drugs. Some items may interact with your medicine. This list may not describe all possible interactions. Give your health care provider a list of all the medicines, herbs, non-prescription drugs, or dietary supplements you use. Also tell them if you smoke, drink alcohol, or use illegal drugs. Some items may interact with your medicine. What should I watch for while using this medicine? This drug may make you feel generally unwell. Continue your course of treatment even though you feel ill unless your doctor tells you  to stop. You may need blood work done while you are taking this medicine. Do not become pregnant while taking this medicine or for 5 months after stopping it. Women should inform their doctor if they wish to become  pregnant or think they might be pregnant. There is a potential for serious side effects to an unborn child. Talk to your health care professional or pharmacist for more information. Do not breast-feed an infant while taking this medicine. What side effects may I notice from receiving this medicine? Side effects that you should report to your doctor or health care professional as soon as possible: -allergic reactions like skin rash, itching or hives, swelling of the face, lips, or tongue -black, tarry stools -blood in the urine -bloody or watery diarrhea -changes in vision -change in sex drive -changes in emotions or moods -chest pain -confusion -cough -decreased appetite -diarrhea -facial flushing -feeling faint or lightheaded -fever, chills -hair loss -hallucination, loss of contact with reality -headache -irritable -joint pain -loss of memory -muscle pain -muscle weakness -seizures -shortness of breath -signs and symptoms of high blood sugar such as dizziness; dry mouth; dry skin; fruity breath; nausea; stomach pain; increased hunger or thirst; increased urination -signs and symptoms of kidney injury like trouble passing urine or change in the amount of urine -signs and symptoms of liver injury like dark yellow or brown urine; general ill feeling or flu-like symptoms; light-colored stools; loss of appetite; nausea; right upper belly pain; unusually weak or tired; yellowing of the eyes or skin -stiff neck -swelling of the ankles, feet, hands -weight gain Side effects that usually do not require medical attention (report to your doctor or health care professional if they continue or are bothersome): -bone pain -constipation -tiredness -vomiting This list may not describe all possible side effects. Call your doctor for medical advice about side effects. You may report side effects to FDA at 1-800-FDA-1088. Where should I keep my medicine? This drug is given in a hospital or  clinic and will not be stored at home. NOTE: This sheet is a summary. It may not cover all possible information. If you have questions about this medicine, talk to your doctor, pharmacist, or health care provider.  2018 Elsevier/Gold Standard (2016-07-23 17:49:34)

## 2017-09-30 ENCOUNTER — Telehealth: Payer: Self-pay | Admitting: *Deleted

## 2017-09-30 NOTE — Telephone Encounter (Signed)
Pt sister Rise Paganini, called with concerns regarding pt having incontinence and stomach pain. Reviewed pt chart and he did not r/s his appt with urology for this concern that has been ongoing for some time. [place] regarding her concern. Encouraged her to discuss with pt and have him to r/s the urology appt. She advised pt is aware he needs to go back to Urology and she will assist him with the appt.No further concerns.

## 2017-10-09 ENCOUNTER — Ambulatory Visit: Payer: Medicare HMO | Admitting: Internal Medicine

## 2017-10-09 ENCOUNTER — Other Ambulatory Visit: Payer: Medicare HMO

## 2017-10-09 ENCOUNTER — Ambulatory Visit: Payer: Medicare HMO

## 2017-10-18 ENCOUNTER — Telehealth: Payer: Self-pay | Admitting: Medical Oncology

## 2017-10-18 NOTE — Telephone Encounter (Signed)
[name] called to report that the pts  visit at cancer center was recorded and put on facebook.  I returned call and left message to call me back.

## 2017-10-24 ENCOUNTER — Ambulatory Visit: Payer: Medicare HMO | Admitting: Oncology

## 2017-10-24 ENCOUNTER — Other Ambulatory Visit: Payer: Medicare HMO

## 2017-10-24 ENCOUNTER — Ambulatory Visit: Payer: Medicare HMO

## 2017-10-29 DEATH — deceased

## 2017-11-19 ENCOUNTER — Ambulatory Visit: Payer: Medicare HMO | Admitting: Internal Medicine

## 2017-11-19 ENCOUNTER — Ambulatory Visit: Payer: Medicare HMO

## 2017-11-19 ENCOUNTER — Other Ambulatory Visit: Payer: Medicare HMO

## 2019-03-12 IMAGING — MR MR HEAD WO/W CM
10 of 13 series · 34 of 48 positions shown · IV contrast (multihance)
Comparison: 02/07/2011

CLINICAL DATA: Initial staging of lung cancer.

EXAM:
MRI HEAD WITHOUT AND WITH CONTRAST
TECHNIQUE: Multiplanar, multiecho pulse sequences of the brain and surrounding
structures were obtained without and with intravenous contrast.
CONTRAST:  18mL MULTIHANCE GADOBENATE DIMEGLUMINE 529 MG/ML IV SOLN

[Series 3: T1 · sagittal · 5.0mm · 0.47mm/px · 2 of 24 slices shown]
[im 1/24]
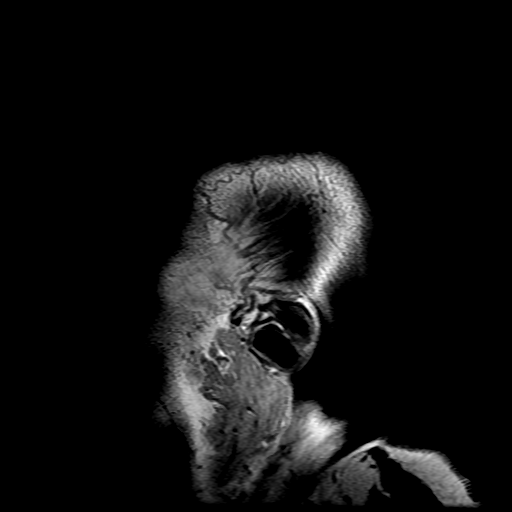
[im 24/24]
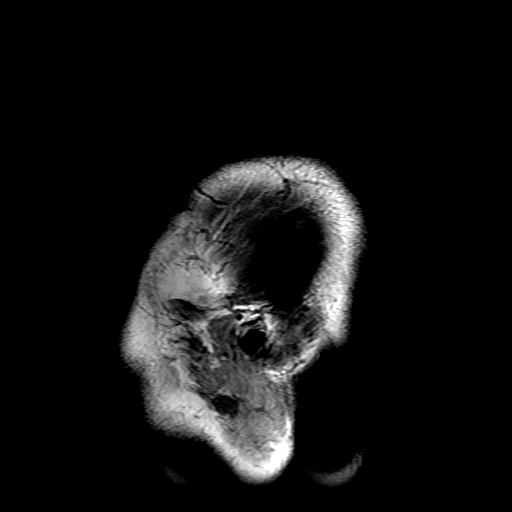

[Series 4: DWI · axial · 3.0mm · 1.09mm/px · z∈[-58,+80]mm · 9 of 96 slices shown (1 of 4)]
[im 1/96]
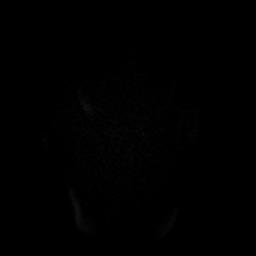
[im 12/96]
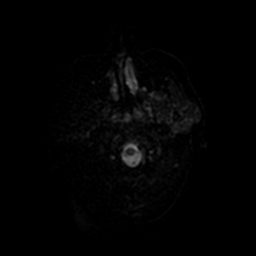
[im 24/96]
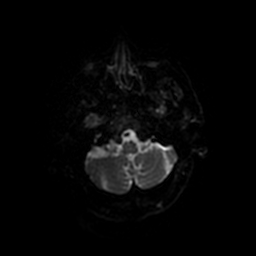
[im 36/96]
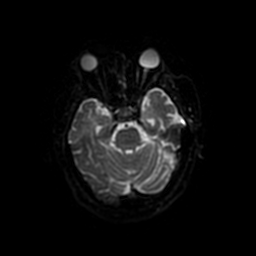
[im 48/96]
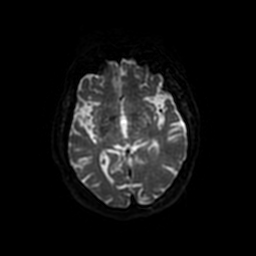
[im 60/96]
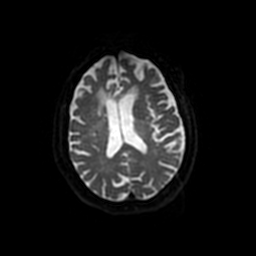
[im 72/96]
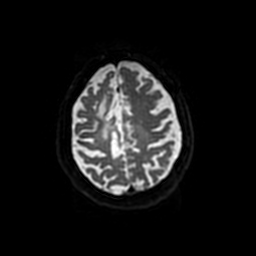
[im 84/96]
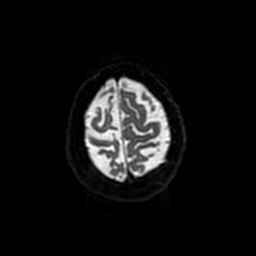
[im 96/96]
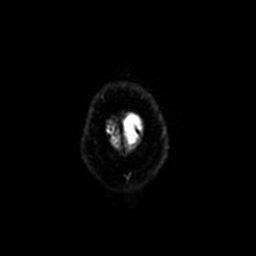

[Series 5: DWI · coronal · 5.0mm · 1.09mm/px · 6 of 60 slices shown (2 of 4)]
[im 1/60]
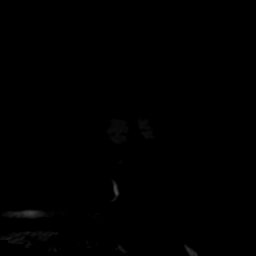
[im 12/60]
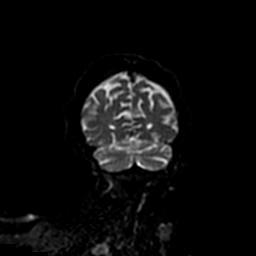
[im 24/60]
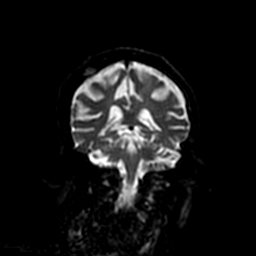
[im 36/60]
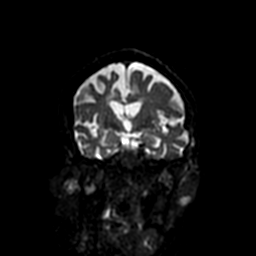
[im 48/60]
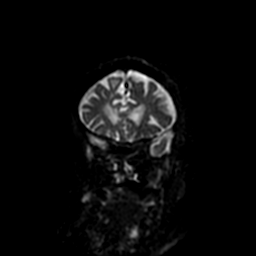
[im 60/60]
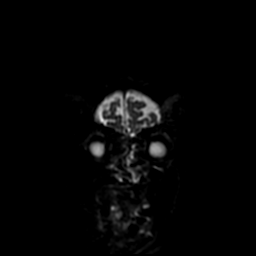

[Series 6: T2 · axial · 5.0mm · 0.43mm/px · z∈[-64,+80]mm · 2 of 24 slices shown]
[im 1/24]
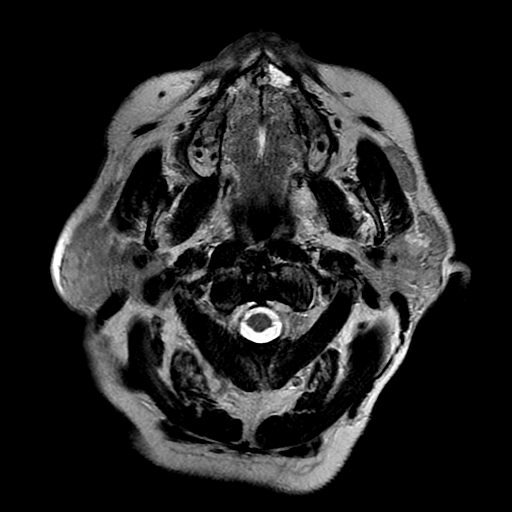
[im 24/24]
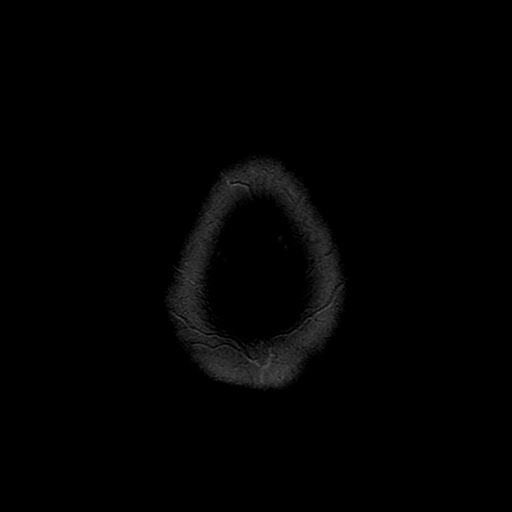

[Series 7: FLAIR · axial · 5.0mm · 0.43mm/px · z∈[-64,+80]mm · 2 of 24 slices shown]
[im 1/24]
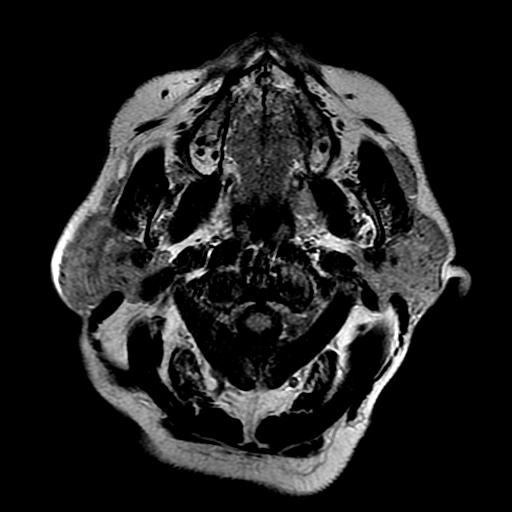
[im 24/24]
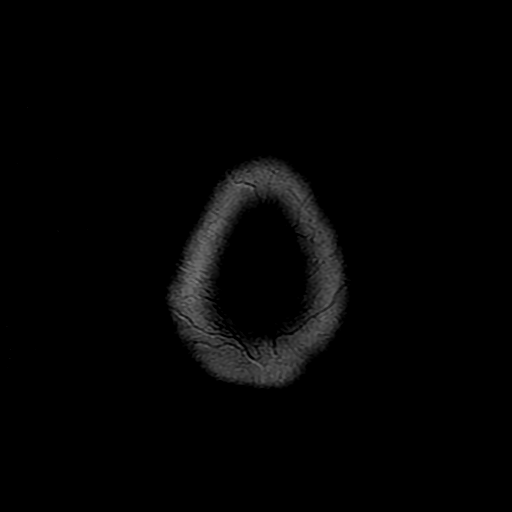

[Series 10: T2 post-contrast · coronal · 5.0mm · 0.47mm/px · 2 of 24 slices shown]
[im 1/24]
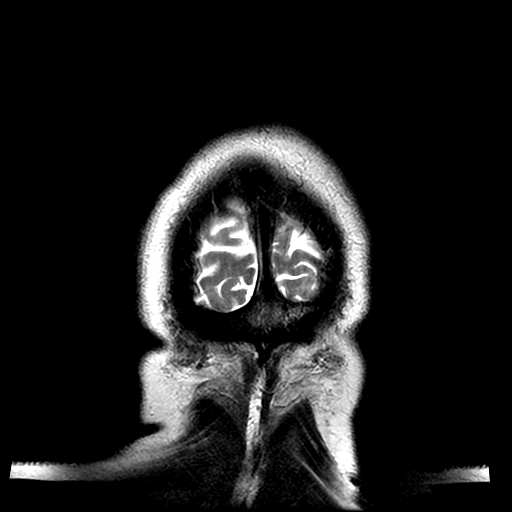
[im 24/24]
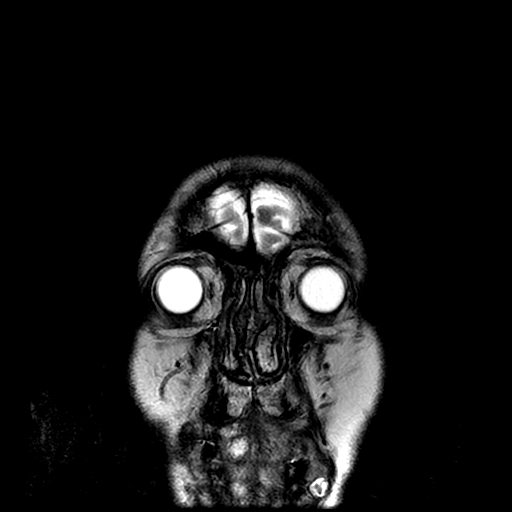

[Series 12: T1 post-contrast · coronal · 5.0mm · 0.47mm/px · 2 of 24 slices shown (1 of 2)]
[im 1/24]
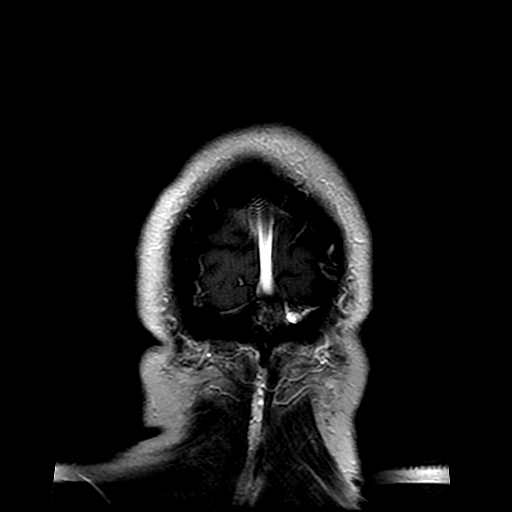
[im 24/24]
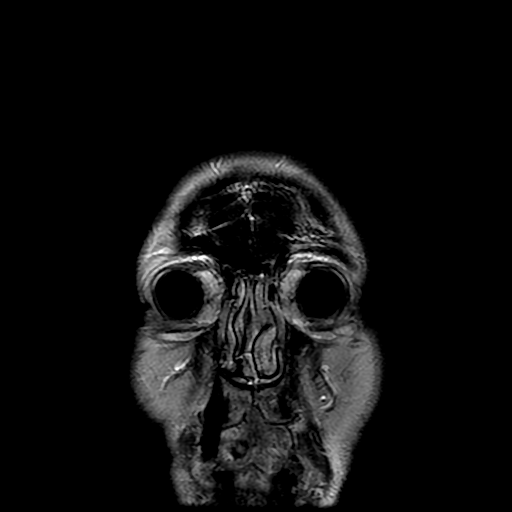

[Series 13: T1 post-contrast · sagittal · 5.0mm · 0.47mm/px · 2 of 24 slices shown (2 of 2)]
[im 1/24]
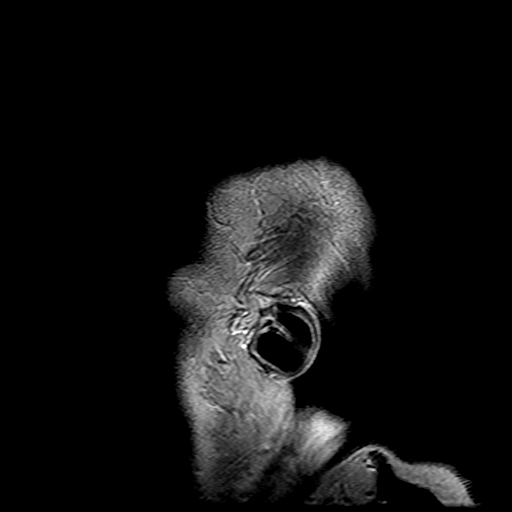
[im 24/24]
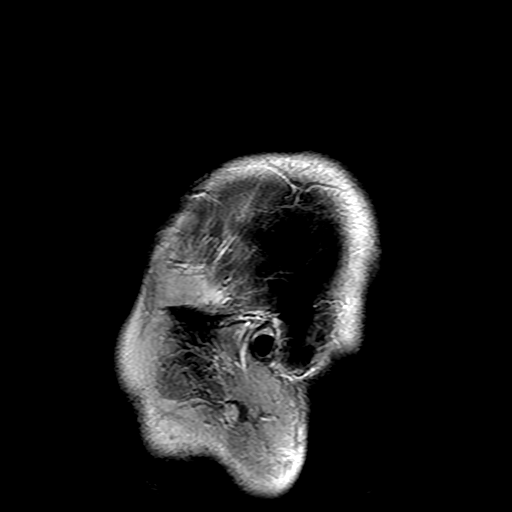

[Series 400: DWI · axial · 3.0mm · 1.09mm/px · z∈[-58,+80]mm · 4 of 48 slices shown (3 of 4)]
[im 1/48]
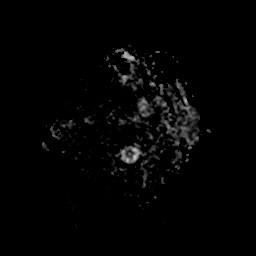
[im 16/48]
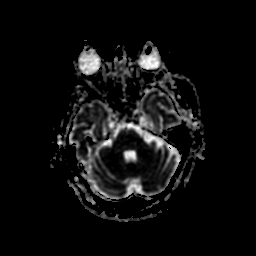
[im 32/48]
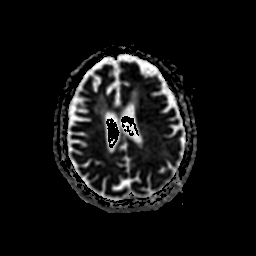
[im 48/48]
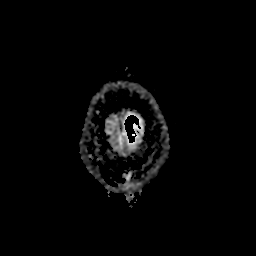

[Series 500: DWI · coronal · 5.0mm · 1.09mm/px · 3 of 30 slices shown (4 of 4)]
[im 1/30]
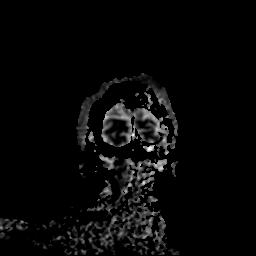
[im 15/30]
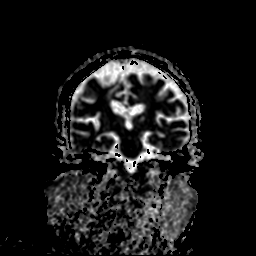
[im 30/30]
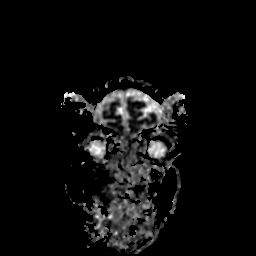

[34 of 48 positions shown; findings below may reference images not displayed]

FINDINGS: Brain: No evidence of intracranial metastatic disease.

Remote moderate area of infarct affecting the parasagittal anterior
right frontal lobe, ACA distribution. There is also ischemic gliosis
in the cerebral white matter with a frontal predominance,
progressed. Remote lacunar infarcts in the left thalamus and right
caudate head. No acute infarct, hemorrhage, or hydrocephalus.

Vascular: Preserved flow void

Skull and upper cervical spine: Heterogeneous cervical spine marrow
is similar to prior and likely degenerative, including a rounded
hypointensity along the inferior aspect of the C3 body. Facet
arthropathy that is advanced on the right. Apparent nodular area in
the right aspect of the intrinsic tongue is also stable from 0830.
There is a bone lesion in the right parietal bone which is stable
from 0830 and has features of hemangioma by 0830 CT.

Sinuses/Orbits: Negative for mass

Other: Tornwaldt cyst. Bilateral enhancing parotid nodules, up to 18
mm on the right, stable since at least 0830.
IMPRESSION: 1. Negative for intracranial metastasis.
2. Bilateral parotid nodules measuring up to 18 mm on the right,
stable from 0830. These could reflect Warthin's tumor in this
smoker.
3. Remote distal ACA territory infarct on the right.
